# Patient Record
Sex: Female | Born: 1935 | Race: Black or African American | Hispanic: No | Smoking: Never smoker
Health system: Southern US, Community
[De-identification: ages and names within clinical notes are randomized; demographics above are authoritative.]

## PROBLEM LIST (undated history)

## (undated) DIAGNOSIS — E876 Hypokalemia: Secondary | ICD-10-CM

## (undated) DIAGNOSIS — E7800 Pure hypercholesterolemia, unspecified: Secondary | ICD-10-CM

## (undated) DIAGNOSIS — I10 Essential (primary) hypertension: Secondary | ICD-10-CM

---

## 2007-03-25 ENCOUNTER — Inpatient Hospital Stay (HOSPITAL_COMMUNITY): Admission: EM | Admit: 2007-03-25 | Discharge: 2007-03-26 | Payer: Self-pay | Admitting: Emergency Medicine

## 2007-03-25 ENCOUNTER — Ambulatory Visit: Payer: Self-pay | Admitting: Cardiovascular Disease

## 2007-03-25 ENCOUNTER — Ambulatory Visit: Payer: Self-pay | Admitting: Family Medicine

## 2007-03-26 ENCOUNTER — Encounter: Payer: Self-pay | Admitting: Family Medicine

## 2007-03-26 ENCOUNTER — Ambulatory Visit: Payer: Self-pay | Admitting: Vascular Surgery

## 2011-01-23 NOTE — Discharge Summary (Signed)
Kristy Torres, Kristy Torres              ACCOUNT NO.:  [id number]   MEDICAL RECORD NO.:  [id number]          PATIENT TYPE:  INP   LOCATION:  3709                         FACILITY:  [facility]   PHYSICIAN:  Santiago Bumpers. Hensel, M.D.DATE OF BIRTH:  [birth year]   DATE OF ADMISSION:  03/25/2007  DATE OF DISCHARGE:  03/26/2007                               DISCHARGE SUMMARY   PRIMARY CARE PHYSICIAN:  Dr. Lonie Peak in [place], [place].   CONSULTANTS:  None.   PROCEDURES:  Echocardiogram and carotid Dopplers.   REASON FOR ADMISSION:  Right-sided arm and facial numbness and weakness  presumed to be TIA.   DISCHARGE DIAGNOSES:  1. Transient ischemic attack.  2. Diabetes mellitus.  3. Hypertension.  4. Hypokalemia.   DISCHARGE MEDICATIONS:  1. Zestoretic 12.5/10 mg 1 tablet daily #14.  This was a new      prescription.  2. Caduet 10/10 mg 1 daily.  3. Klor-Con 20 mEq 1 daily.  4. Glipizide ER 2.5 mg 1 daily.  5. Atenolol 50 mg daily.  6. Aspirin 325 mg daily.   MEDICATIONS THAT WERE STOPPED:  Triamterene/hydrochlorothiazide.  This  medication was discontinued and Zestoretic was started instead because  ACE inhibitors have been shown to be beneficial to people with diabetes  and to decrease the risk of stroke.  The hydrochlorothiazide dose was  decreased due to low potassium despite daily potassium supplement.   HOSPITAL COURSE:  Kristy Torres is an 77 year old female who presented to  the emergency room with a brief episode of right-sided arm and facial  numbness as well as generalized weakness.  She called an ambulance when  this began.  By the time she arrived at the hospital, the episode had  resolved and she was no longer experiencing any numbness or weakness.  This was believed to be a TIA.  She states, approximately 12 or 14 years  ago, she had been told that she also had a TIA at that time that was a  headache with some numbness and weakness.  She has been asymptomatic in  those 12 to 14 years in between.  As her TIA was already thought to have  resolved when she arrived at [facility], she was admitted for workup and  risk ratification.  The patient had not previously been on aspirin;  therefore, she was started on aspirin 325 mg daily.  Cholesterol was  checked and her total cholesterol was 142 and HDL was wonderful at 65  and LDL was at goal of 56.  Triglycerides were 104 and Kristy Torres is on  cholesterol medicine as part of her Caduet which is a combination drug  of Norvasc and Lipitor.  She had an echocardiogram done that showed no  cardiac source for emboli.  She also had carotid Dopplers done which  were negative showing no narrowing or stenosis of the internal carotid  arteries.  Her labs all remained stable with the exception of her  potassium which was 3.2 on admission and 3.4 on the day of discharge.  She does take a daily potassium supplement.  She is believed to  be  hypokalemic due to her hydrochlorothiazide.  Regarding her diabetes,  when she came into the hospital we did hold her Glipizide until we could  see how she was eating.  She did begin to eat on the day of discharge  and the Glipizide was restarted at the time of discharge.  Regarding her  hypertension, her blood pressure was mildly elevated in the 140s and  150s and throughout her hospital stay; however, we did not worry about  this at all as the elevated blood pressure is tolerated and even  beneficial in the post stroke or even post TIA patient.  As a new  hypertension medicine was started at discharge, this should be followed  up closely in the outpatient setting and, regarding her hypokalemia, we  also decreased her hydrochlorothiazide dose in hopes that this would  help with her chronic hypokalemia.  The patient was discharged home in  good condition with her family.  She was instructed on  things that  would make her want to call the ambulance to come back to the hospital  again,  including any other episodes of numbness, tingling or weakness  especially if they were to be one-sided, as occasionally TIAs can serve  as a signal for a stroke waiting to happen in the future.   DISCHARGE FOLLOWUP:  While we generally try to make a follow-up  appointment before they leave, I was unable to reach her doctor's office  on 3 attempts today.  The line was busy; therefore, we asked her to make  an appointment herself when she got home to be seen for hospital  followup in no more than 2 weeks.   FOLLOWUP ISSUES:  I was unable to reach her doctor to discuss the  changing of her blood pressure medications from Maxzide to Zestoretic;  therefore, we would suggest he followup on her blood pressure and  calcium and consider titrating the Zestoretic up if needed and, if for  some reason she does not tolerate the ACE inhibitor and the Zestoretic,  he can discontinue and restart a blood pressure medicine of his choice.  We would also suggest a BMP be drawn to check the status of her  hypokalemia.      Ardeen Garland, MD  Electronically Signed      Santiago Bumpers. Leveda Anna, M.D.  Electronically Signed    LM/MEDQ  D:  03/26/2007  T:  03/27/2007  Job:  [id number]   cc:   Lonie Peak

## 2011-06-25 LAB — CBC
HCT: 40.1
Hemoglobin: 13.3
MCHC: 33.1
MCHC: 33.2
Platelets: 212
RDW: 13.6
RDW: 13.7

## 2011-06-25 LAB — I-STAT 8, (EC8 V) (CONVERTED LAB)
Bicarbonate: 22
Glucose, Bld: 145 — ABNORMAL HIGH
TCO2: 23
pCO2, Ven: 31.3 — ABNORMAL LOW
pH, Ven: 7.455 — ABNORMAL HIGH

## 2011-06-25 LAB — TSH: TSH: 2.397

## 2011-06-25 LAB — DIFFERENTIAL
Basophils Absolute: 0.1
Basophils Relative: 1
Eosinophils Relative: 2
Monocytes Absolute: 0.5
Monocytes Relative: 8

## 2011-06-25 LAB — LIPID PANEL
Cholesterol: 142
HDL: 65
Total CHOL/HDL Ratio: 2.2
VLDL: 21

## 2011-06-25 LAB — CARDIAC PANEL(CRET KIN+CKTOT+MB+TROPI)
CK, MB: 2.6
CK, MB: 2.8
Relative Index: 1.8
Total CK: 146
Total CK: 147
Troponin I: 0.05
Troponin I: 0.06

## 2011-06-25 LAB — URINALYSIS, ROUTINE W REFLEX MICROSCOPIC
Glucose, UA: NEGATIVE
Nitrite: NEGATIVE
Protein, ur: NEGATIVE

## 2011-06-25 LAB — HEMOGLOBIN A1C: Hgb A1c MFr Bld: 6.5 — ABNORMAL HIGH

## 2011-06-25 LAB — BASIC METABOLIC PANEL
Calcium: 9.3
GFR calc Af Amer: >60
GFR calc non Af Amer: >60
Glucose, Bld: 105 — ABNORMAL HIGH
Potassium: 3.4 — ABNORMAL LOW
Sodium: 140

## 2011-06-25 LAB — URINE MICROSCOPIC-ADD ON

## 2011-06-25 LAB — POCT I-STAT CREATININE: Operator id: 234501

## 2011-10-08 DIAGNOSIS — Z79899 Other long term (current) drug therapy: Secondary | ICD-10-CM | POA: Diagnosis not present

## 2011-10-08 DIAGNOSIS — E782 Mixed hyperlipidemia: Secondary | ICD-10-CM | POA: Diagnosis not present

## 2011-10-08 DIAGNOSIS — E7800 Pure hypercholesterolemia, unspecified: Secondary | ICD-10-CM | POA: Diagnosis not present

## 2011-10-08 DIAGNOSIS — G459 Transient cerebral ischemic attack, unspecified: Secondary | ICD-10-CM | POA: Diagnosis not present

## 2011-10-08 DIAGNOSIS — E119 Type 2 diabetes mellitus without complications: Secondary | ICD-10-CM | POA: Diagnosis not present

## 2011-10-08 DIAGNOSIS — I10 Essential (primary) hypertension: Secondary | ICD-10-CM | POA: Diagnosis not present

## 2012-01-06 ENCOUNTER — Emergency Department (HOSPITAL_COMMUNITY): Payer: Medicare Other

## 2012-01-06 ENCOUNTER — Encounter (HOSPITAL_COMMUNITY): Payer: Self-pay | Admitting: Emergency Medicine

## 2012-01-06 ENCOUNTER — Emergency Department (HOSPITAL_COMMUNITY)
Admission: EM | Admit: 2012-01-06 | Discharge: 2012-01-06 | Disposition: A | Discharge status: Home / Self Care | Payer: Medicare Other | Attending: Emergency Medicine | Admitting: Emergency Medicine

## 2012-01-06 DIAGNOSIS — I10 Essential (primary) hypertension: Secondary | ICD-10-CM | POA: Insufficient documentation

## 2012-01-06 DIAGNOSIS — E7800 Pure hypercholesterolemia, unspecified: Secondary | ICD-10-CM | POA: Insufficient documentation

## 2012-01-06 DIAGNOSIS — I499 Cardiac arrhythmia, unspecified: Secondary | ICD-10-CM | POA: Insufficient documentation

## 2012-01-06 DIAGNOSIS — M25569 Pain in unspecified knee: Secondary | ICD-10-CM | POA: Diagnosis not present

## 2012-01-06 DIAGNOSIS — R42 Dizziness and giddiness: Secondary | ICD-10-CM | POA: Insufficient documentation

## 2012-01-06 DIAGNOSIS — R197 Diarrhea, unspecified: Secondary | ICD-10-CM | POA: Diagnosis not present

## 2012-01-06 DIAGNOSIS — Z79899 Other long term (current) drug therapy: Secondary | ICD-10-CM | POA: Insufficient documentation

## 2012-01-06 DIAGNOSIS — M79609 Pain in unspecified limb: Secondary | ICD-10-CM | POA: Diagnosis not present

## 2012-01-06 DIAGNOSIS — I6789 Other cerebrovascular disease: Secondary | ICD-10-CM | POA: Diagnosis not present

## 2012-01-06 DIAGNOSIS — M25562 Pain in left knee: Secondary | ICD-10-CM

## 2012-01-06 DIAGNOSIS — E119 Type 2 diabetes mellitus without complications: Secondary | ICD-10-CM | POA: Diagnosis not present

## 2012-01-06 DIAGNOSIS — M7989 Other specified soft tissue disorders: Secondary | ICD-10-CM

## 2012-01-06 DIAGNOSIS — I69998 Other sequelae following unspecified cerebrovascular disease: Secondary | ICD-10-CM | POA: Diagnosis not present

## 2012-01-06 HISTORY — DX: Hypokalemia: E87.6

## 2012-01-06 HISTORY — DX: Pure hypercholesterolemia, unspecified: E78.00

## 2012-01-06 HISTORY — DX: Essential (primary) hypertension: I10

## 2012-01-06 LAB — URINALYSIS, ROUTINE W REFLEX MICROSCOPIC
Glucose, UA: NEGATIVE mg/dL
Hgb urine dipstick: NEGATIVE
Ketones, ur: NEGATIVE mg/dL
Leukocytes, UA: NEGATIVE
Protein, ur: NEGATIVE mg/dL
Urobilinogen, UA: 0.2 mg/dL (ref 0.0–1.0)

## 2012-01-06 LAB — DIFFERENTIAL
Basophils Relative: 0 % (ref 0–1)
Eosinophils Absolute: 0.2 10*3/uL (ref 0.0–0.7)
Eosinophils Relative: 2 % (ref 0–5)
Lymphs Abs: 2 10*3/uL (ref 0.7–4.0)
Neutrophils Relative %: 63 % (ref 43–77)

## 2012-01-06 LAB — POCT I-STAT, CHEM 8
Calcium, Ion: 1.28 mmol/L (ref 1.12–1.32)
Chloride: 104 mEq/L (ref 96–112)
Creatinine, Ser: 1 mg/dL (ref 0.50–1.10)
Glucose, Bld: 126 mg/dL — ABNORMAL HIGH (ref 70–99)
Hemoglobin: 13.6 g/dL (ref 12.0–15.0)
Potassium: 4.4 mEq/L (ref 3.5–5.1)

## 2012-01-06 LAB — CBC
MCH: 32.6 pg (ref 26.0–34.0)
MCHC: 33.8 g/dL (ref 30.0–36.0)
MCV: 96.3 fL (ref 78.0–100.0)
Platelets: 219 10*3/uL (ref 150–400)
RBC: 4.08 MIL/uL (ref 3.87–5.11)

## 2012-01-06 NOTE — ED Provider Notes (Signed)
History     CSN: [id number]  Arrival date & time 01/06/12  1530   First MD Initiated Contact with Patient 01/06/12 1548      Chief Complaint  Patient presents with  . Knee Pain  . Dizziness  . Diarrhea    (Consider location/radiation/quality/duration/timing/severity/associated sxs/prior treatment) HPI Comments: Vision presents today with 3 individual complaints first being diarrhea, which consists of several loose stools per day for the last 2 days.  Denies seeing blood, nausea, vomiting.  Second complaint is dizziness, she cannot relate if this is with position change.  Cannot tell in the room is spinning or she is spending to invade the feeling of, dizziness that's been going on for "a while".  Third complaint is left posterior knee pain that has been going on for "a while" but was worse today while walking.  Denies injury, lower leg swelling, history of DVT.  Patient is a 78 y.o. female presenting with knee pain and diarrhea. The history is provided by the patient.  Knee Pain This is a recurrent problem. The current episode started 1 to 4 weeks ago. The problem occurs intermittently. The problem has been unchanged. Associated symptoms include vertigo. Pertinent negatives include no chest pain, chills, coughing, myalgias, nausea, rash or urinary symptoms.  Diarrhea The primary symptoms include diarrhea. Primary symptoms do not include nausea, myalgias or rash.  The illness does not include chills.    Past Medical History  Diagnosis Date  . Diabetes mellitus   . Hypertension   . Hypokalemia   . High cholesterol     No past surgical history on file.  No family history on file.  History  Substance Use Topics  . Smoking status: Never Smoker   . Smokeless tobacco: Not on file  . Alcohol Use: No    OB History    Grav Para Term Preterm Abortions TAB SAB Ect Mult Living                  Review of Systems  Constitutional: Negative for chills.  Respiratory: Negative for  cough.   Cardiovascular: Negative for chest pain.  Gastrointestinal: Positive for diarrhea. Negative for nausea.  Musculoskeletal: Negative for myalgias.  Skin: Negative for rash.  Neurological: Positive for vertigo.    Allergies  Review of patient's allergies indicates no known allergies.  Home Medications   Current Outpatient Rx  Name Route Sig Dispense Refill  . ATENOLOL 50 MG PO TABS Oral Take 50 mg by mouth daily.    . ATORVASTATIN CALCIUM 10 MG PO TABS Oral Take 10 mg by mouth daily.    Marland Kitchen GLIPIZIDE ER 2.5 MG PO TB24 Oral Take 2.5 mg by mouth daily.    Marland Kitchen LOSARTAN POTASSIUM-HCTZ 100-25 MG PO TABS Oral Take 1 tablet by mouth daily.    Marland Kitchen POTASSIUM CHLORIDE CRYS ER 20 MEQ PO TBCR Oral Take 20 mEq by mouth 2 (two) times daily.      BP 167/68  Pulse 74  Temp(Src) 98.8 F (37.1 C) (Oral)  Resp 17  SpO2 97%  Physical Exam  Constitutional: She is oriented to person, place, and time. She appears well-developed and well-nourished.  HENT:  Head: Normocephalic.  Eyes: Pupils are equal, round, and reactive to light.  Neck: Normal range of motion.  Cardiovascular: Normal rate.  An irregular rhythm present.  Pulmonary/Chest: Effort normal.  Abdominal: Soft. Bowel sounds are normal. She exhibits no distension. There is no tenderness.       No rash abrasions  on abdomen   Musculoskeletal: She exhibits no edema.       Legs:      Calf tenderness or swelling  Neurological: She is alert and oriented to person, place, and time.  Skin: Skin is warm and dry. No rash noted. No erythema.    ED Course  Procedures (including critical care time)  Labs Reviewed  POCT I-STAT, CHEM 8 - Abnormal; Notable for the following:    Glucose, Bld 126 (*)    All other components within normal limits  CBC  DIFFERENTIAL  URINALYSIS, ROUTINE W REFLEX MICROSCOPIC   Ct Head Wo Contrast  01/06/2012  *RADIOLOGY REPORT*  Clinical Data: Dizziness for two days.  CT HEAD WITHOUT CONTRAST  Technique:   Contiguous axial images were obtained from the base of the skull through the vertex without contrast.  Comparison: Head CT 03/25/2007.  Findings: There is new encephalomalacia in the left occipital lobe consistent with an interval nonacute infarct in the left posterior cerebral artery distribution.  There is mildly progressive chronic small vessel ischemic change in the periventricular white matter. No acute intracranial hemorrhage, mass lesion, brain edema or extra- axial fluid collection is seen.  There is no evidence of acute infarct.  The visualized paranasal sinuses, mastoids and middle ears are clear.  The internal auditory canals appear symmetric.  IMPRESSION:  1.  No acute or reversible intracranial findings identified. 2.  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  This does not have an acute appearance. Mildly progressive chronic small vessel ischemic changes.  Original Report Authenticated By: Gerrianne Scale, M.D.     1. Hypertension   2. Knee pain, left   3. Dizzy spells    ED ECG REPORT   Date: 01/06/2012  EKG Time: 6:48 PM  Rate: 73  Rhythm: normal sinus rhythm,  there are no previous tracings available for comparison  Axis: normal  Intervals:none  ST&T Change: multiple PVC unifocal  Narrative Interpretation: abnormal             MDM  Bakers cyst a possibility L knee area.  She is quite hypertensive, although she, states she's been taking her medication on a regular basis, right after breakfast.  She states that when she saw her primary care physician 3 months ago.  It was in the high normal, range.  She's not due for a reexamination by him for another 3 months.  She has been taking the same blood pressure medicine for "a while"        Arman Filter, NP 01/06/12 [phone]

## 2012-01-06 NOTE — ED Notes (Signed)
Pt denies SOB

## 2012-01-06 NOTE — Discharge Instructions (Signed)
Arterial Hypertension Arterial hypertension (high blood pressure) is a condition of elevated pressure in your blood vessels. Hypertension over a long period of time is a risk factor for strokes, heart attacks, and heart failure. It is also the leading cause of kidney (renal) failure.  CAUSES   In Adults -- Over 90% of all hypertension has no known cause. This is called essential or primary hypertension. In the other 10% of people with hypertension, the increase in blood pressure is caused by another disorder. This is called secondary hypertension. Important causes of secondary hypertension are:   Heavy alcohol use.   Obstructive sleep apnea.   Hyperaldosterosim (Conn's syndrome).   Steroid use.   Chronic kidney failure.   Hyperparathyroidism.   Medications.   Renal artery stenosis.   Pheochromocytoma.   Cushing's disease.   Coarctation of the aorta.   Scleroderma renal crisis.   Licorice (in excessive amounts).   Drugs (cocaine, methamphetamine).  Your caregiver can explain any items above that apply to you.  In Children -- Secondary hypertension is more common and should always be considered.   Pregnancy -- Few women of childbearing age have high blood pressure. However, up to 10% of them develop hypertension of pregnancy. Generally, this will not harm the woman. It Even be a sign of 3 complications of pregnancy: preeclampsia, HELLP syndrome, and eclampsia. Follow up and control with medication is necessary.  SYMPTOMS   This condition normally does not produce any noticeable symptoms. It is usually found during a routine exam.   Malignant hypertension is a late problem of high blood pressure. It Monger have the following symptoms:   Headaches.   Blurred vision.   End-organ damage (this means your kidneys, heart, lungs, and other organs are being damaged).   Stressful situations can increase the blood pressure. If a person with normal blood pressure has their blood  pressure go up while being seen by their caregiver, this is often termed "white coat hypertension." Its importance is not known. It Alkire be related with eventually developing hypertension or complications of hypertension.   Hypertension is often confused with mental tension, stress, and anxiety.  DIAGNOSIS  The diagnosis is made by 3 separate blood pressure measurements. They are taken at least 1 week apart from each other. If there is organ damage from hypertension, the diagnosis Lemire be made without repeat measurements. Hypertension is usually identified by having blood pressure readings:  Above 140/90 mmHg measured in both arms, at 3 separate times, over a couple weeks.   Over 130/80 mmHg should be considered a risk factor and Grisanti require treatment in patients with diabetes.  Blood pressure readings over 120/80 mmHg are called "pre-hypertension" even in non-diabetic patients. To get a true blood pressure measurement, use the following guidelines. Be aware of the factors that can alter blood pressure readings.  Take measurements at least 1 hour after caffeine.   Take measurements 30 minutes after smoking and without any stress. This is another reason to quit smoking - it raises your blood pressure.   Use a proper cuff size. Ask your caregiver if you are not sure about your cuff size.   Most home blood pressure cuffs are automatic. They will measure systolic and diastolic pressures. The systolic pressure is the pressure reading at the start of sounds. Diastolic pressure is the pressure at which the sounds disappear. If you are elderly, measure pressures in multiple postures. Try sitting, lying or standing.   Sit at rest for a minimum of   5 minutes before taking measurements.   You should not be on any medications like decongestants. These are found in many cold medications.   Record your blood pressure readings and review them with your caregiver.  If you have hypertension:  Your caregiver  may do tests to be sure you do not have secondary hypertension (see "causes" above).   Your caregiver may also look for signs of metabolic syndrome. This is also called Syndrome X or Insulin Resistance Syndrome. You may have this syndrome if you have type 2 diabetes, abdominal obesity, and abnormal blood lipids in addition to hypertension.   Your caregiver will take your medical and family history and perform a physical exam.   Diagnostic tests may include blood tests (for glucose, cholesterol, potassium, and kidney function), a urinalysis, or an EKG. Other tests may also be necessary depending on your condition.  PREVENTION  There are important lifestyle issues that you can adopt to reduce your chance of developing hypertension:  Maintain a normal weight.   Limit the amount of salt (sodium) in your diet.   Exercise often.   Limit alcohol intake.   Get enough potassium in your diet. Discuss specific advice with your caregiver.   Follow a DASH diet (dietary approaches to stop hypertension). This diet is rich in fruits, vegetables, and low-fat dairy products, and avoids certain fats.  PROGNOSIS  Essential hypertension cannot be cured. Lifestyle changes and medical treatment can lower blood pressure and reduce complications. The prognosis of secondary hypertension depends on the underlying cause. Many people whose hypertension is controlled with medicine or lifestyle changes can live a normal, healthy life.  RISKS AND COMPLICATIONS  While high blood pressure alone is not an illness, it often requires treatment due to its short- and long-term effects on many organs. Hypertension increases your risk for:  CVAs or strokes (cerebrovascular accident).   Heart failure due to chronically high blood pressure (hypertensive cardiomyopathy).   Heart attack (myocardial infarction).   Damage to the retina (hypertensive retinopathy).   Kidney failure (hypertensive nephropathy).  Your caregiver can  explain list items above that apply to you. Treatment of hypertension can significantly reduce the risk of complications. TREATMENT   For overweight patients, weight loss and regular exercise are recommended. Physical fitness lowers blood pressure.   Mild hypertension is usually treated with diet and exercise. A diet rich in fruits and vegetables, fat-free dairy products, and foods low in fat and salt (sodium) can help lower blood pressure. Decreasing salt intake decreases blood pressure in a 1/3 of people.   Stop smoking if you are a smoker.  The steps above are highly effective in reducing blood pressure. While these actions are easy to suggest, they are difficult to achieve. Most patients with moderate or severe hypertension end up requiring medications to bring their blood pressure down to a normal level. There are several classes of medications for treatment. Blood pressure pills (antihypertensives) will lower blood pressure by their different actions. Lowering the blood pressure by 10 mmHg may decrease the risk of complications by as much as 25%. The goal of treatment is effective blood pressure control. This will reduce your risk for complications. Your caregiver will help you determine the best treatment for you according to your lifestyle. What is excellent treatment for one person, may not be for you. HOME CARE INSTRUCTIONS   Do not smoke.   Follow the lifestyle changes outlined in the "Prevention" section.   If you are on medications, follow the directions   carefully. Blood pressure medications must be taken as prescribed. Skipping doses reduces their benefit. It also puts you at risk for problems.   Follow up with your caregiver, as directed.   If you are asked to monitor your blood pressure at home, follow the guidelines in the "Diagnosis" section above.  SEEK MEDICAL CARE IF:   You think you are having medication side effects.   You have recurrent headaches or lightheadedness.     You have swelling in your ankles.   You have trouble with your vision.  SEEK IMMEDIATE MEDICAL CARE IF:   You have sudden onset of chest pain or pressure, difficulty breathing, or other symptoms of a heart attack.   You have a severe headache.   You have symptoms of a stroke (such as sudden weakness, difficulty speaking, difficulty walking).  MAKE SURE YOU:   Understand these instructions.   Will watch your condition.   Will get help right away if you are not doing well or get worse.  Document Released: 08/27/2005 Document Revised: 08/16/2011 Document Reviewed: 03/27/2007 [facility] Patient Information 2012 [place], [place].Arthralgia Your caregiver has diagnosed you as suffering from an arthralgia. Arthralgia means there is pain in a joint. This can come from many reasons including:  Bruising the joint which causes soreness (inflammation) in the joint.   Wear and tear on the joints which occur as we grow older (osteoarthritis).   Overusing the joint.   Various forms of arthritis.   Infections of the joint.  Regardless of the cause of pain in your joint, most of these different pains respond to anti-inflammatory drugs and rest. The exception to this is when a joint is infected, and these cases are treated with antibiotics, if it is a bacterial infection. HOME CARE INSTRUCTIONS   Rest the injured area for as long as directed by your caregiver. Then slowly start using the joint as directed by your caregiver and as the pain allows. Crutches as directed may be useful if the ankles, knees or hips are involved. If the knee was splinted or casted, continue use and care as directed. If an stretchy or elastic wrapping bandage has been applied today, it should be removed and re-applied every 3 to 4 hours. It should not be applied tightly, but firmly enough to keep swelling down. Watch toes and feet for swelling, bluish discoloration, coldness, numbness or excessive pain. If any of these  problems (symptoms) occur, remove the ace bandage and re-apply more loosely. If these symptoms persist, contact your caregiver or return to this location.   For the first 24 hours, keep the injured extremity elevated on pillows while lying down.   Apply ice for 15 to 20 minutes to the sore joint every couple hours while awake for the first half day. Then 3 to 4 times per day for the first 48 hours. Put the ice in a plastic bag and place a towel between the bag of ice and your skin.   Wear any splinting, casting, elastic bandage applications, or slings as instructed.   Only take over-the-counter or prescription medicines for pain, discomfort, or fever as directed by your caregiver. Do not use aspirin immediately after the injury unless instructed by your physician. Aspirin can cause increased bleeding and bruising of the tissues.   If you were given crutches, continue to use them as instructed and do not resume weight bearing on the sore joint until instructed.  Persistent pain and inability to use the sore joint as directed for  more than 2 to 3 days are warning signs indicating that you should see a caregiver for a follow-up visit as soon as possible. Initially, a hairline fracture (break in bone) may not be evident on X-rays. Persistent pain and swelling indicate that further evaluation, non-weight bearing or use of the joint (use of crutches or slings as instructed), or further X-rays are indicated. X-rays may sometimes not show a small fracture until a week or 10 days later. Make a follow-up appointment with your own caregiver or one to whom we have referred you. A radiologist (specialist in reading X-rays) may read your X-rays. Make sure you know how you are to obtain your X-ray results. Do not assume everything is normal if you do not hear from [place]. SEEK MEDICAL CARE IF: Bruising, swelling, or pain increases. SEEK IMMEDIATE MEDICAL CARE IF:   Your fingers or toes are numb or blue.   The pain is  not responding to medications and continues to stay the same or get worse.   The pain in your joint becomes severe.   You develop a fever over 102 F (38.9 C).   It becomes impossible to move or use the joint.  MAKE SURE YOU:   Understand these instructions.   Will watch your condition.   Will get help right away if you are not doing well or get worse.  Document Released: 08/27/2005 Document Revised: 08/16/2011 Document Reviewed: 04/14/2008 [facility] Patient Information 2012 [place], [place]. As discussed no specific cause for your knee pain has been identified today, you do not have a blood clot or a Baker's cyst.  This was evaluated with vascular Doppler.  You do have poorly controlled hypertension.  Please make a point with your primary care physician to be seen.  This week for further evaluation.  Recheck of your blood pressure and possibly additional medication for better control.  Your head CT is normal.  No indication of stroke or mass that would cause dizziness.  Please followup with your primary care physician for this as wellBenign Positional Vertigo Vertigo means you feel like you or your surroundings are moving when they are not. Benign positional vertigo is the most common form of vertigo. Benign means that the cause of your condition is not serious. Benign positional vertigo is more common in older adults. CAUSES  Benign positional vertigo is the result of an upset in the labyrinth system. This is an area in the middle ear that helps control your balance. This may be caused by a viral infection, head injury, or repetitive motion. However, often no specific cause is found. SYMPTOMS  Symptoms of benign positional vertigo occur when you move your head or eyes in different directions. Some of the symptoms may include:  Loss of balance and falls.   Vomiting.   Blurred vision.   Dizziness.   Nausea.   Involuntary eye movements (nystagmus).  DIAGNOSIS  Benign positional  vertigo is usually diagnosed by physical exam. If the specific cause of your benign positional vertigo is unknown, your caregiver may perform imaging tests, such as magnetic resonance imaging (MRI) or computed tomography (CT). TREATMENT  Your caregiver may recommend movements or procedures to correct the benign positional vertigo. Medicines such as meclizine, benzodiazepines, and medicines for nausea may be used to treat your symptoms. In rare cases, if your symptoms are caused by certain conditions that affect the inner ear, you may need surgery. HOME CARE INSTRUCTIONS   Follow your caregiver's instructions.   Move slowly. Do not  make sudden body or head movements.   Avoid driving.   Avoid operating heavy machinery.   Avoid performing any tasks that would be dangerous to you or others during a vertigo episode.   Drink enough fluids to keep your urine clear or pale yellow.  SEEK IMMEDIATE MEDICAL CARE IF:   You develop problems with walking, weakness, numbness, or using your arms, hands, or legs.   You have difficulty speaking.   You develop severe headaches.   Your nausea or vomiting continues or gets worse.   You develop visual changes.   Your family or friends notice any behavioral changes.   Your condition gets worse.   You have a fever.   You develop a stiff neck or sensitivity to light.  MAKE SURE YOU:   Understand these instructions.   Will watch your condition.   Will get help right away if you are not doing well or get worse.  Document Released: 06/04/2006 Document Revised: 08/16/2011 Document Reviewed: 05/17/2011 [facility] Patient Information 2012 [place], [place].

## 2012-01-06 NOTE — Progress Notes (Signed)
VASCULAR LAB PRELIMINARY  PRELIMINARY  PRELIMINARY  PRELIMINARY  Left lower extremity venous Dopplers completed.    Preliminary report:  There is no DVT, SVT or Baker's cyst noted in the left lower extremity.  Sherren Kerns [place], 01/06/2012, 5:53 PM

## 2012-01-06 NOTE — ED Notes (Signed)
Pt refused in and out cath. Pt used bedpan with nurse tech assistance.

## 2012-01-06 NOTE — ED Notes (Addendum)
Patient transported to ct

## 2012-01-06 NOTE — ED Provider Notes (Signed)
Medical screening examination/treatment/procedure(s) were conducted as a shared visit with non-physician practitioner(s) and myself.  I personally evaluated the patient during the encounter  Patient presents emergent complaining of left knee pain since waking up this morning. she states she's had this trouble previously in the past. The patient states that she came into the emergency room today because she just want to know what was causing it. The pain is not actively severe. At rest it does not bother her. She is not having any trouble with any chest pain or shortness of breath. Patient also incidentally mentions she's had some trouble with diarrhea.  It was more severe couple of days ago and the last loose stool was yesterday. Patient says she's had some intermittent vague dizziness but no trouble with speech or coordination or balance.  Celene Kras, MD 01/06/12 [phone]

## 2012-01-06 NOTE — ED Notes (Addendum)
C/o L knee pain while walking since this morning.  Also reports dizziness and diarrhea x 2 days.

## 2012-04-07 DIAGNOSIS — E119 Type 2 diabetes mellitus without complications: Secondary | ICD-10-CM | POA: Diagnosis not present

## 2012-04-07 DIAGNOSIS — E7800 Pure hypercholesterolemia, unspecified: Secondary | ICD-10-CM | POA: Diagnosis not present

## 2012-04-07 DIAGNOSIS — I10 Essential (primary) hypertension: Secondary | ICD-10-CM | POA: Diagnosis not present

## 2012-04-07 DIAGNOSIS — Z79899 Other long term (current) drug therapy: Secondary | ICD-10-CM | POA: Diagnosis not present

## 2012-10-22 DIAGNOSIS — Z6837 Body mass index (BMI) 37.0-37.9, adult: Secondary | ICD-10-CM | POA: Diagnosis not present

## 2012-10-22 DIAGNOSIS — E119 Type 2 diabetes mellitus without complications: Secondary | ICD-10-CM | POA: Diagnosis not present

## 2012-10-22 DIAGNOSIS — E7800 Pure hypercholesterolemia, unspecified: Secondary | ICD-10-CM | POA: Diagnosis not present

## 2012-10-22 DIAGNOSIS — Z79899 Other long term (current) drug therapy: Secondary | ICD-10-CM | POA: Diagnosis not present

## 2012-10-22 DIAGNOSIS — I10 Essential (primary) hypertension: Secondary | ICD-10-CM | POA: Diagnosis not present

## 2013-02-15 ENCOUNTER — Emergency Department (HOSPITAL_COMMUNITY)
Admission: EM | Admit: 2013-02-15 | Discharge: 2013-02-15 | Disposition: A | Discharge status: Home / Self Care | Payer: Medicare Other | Attending: Emergency Medicine | Admitting: Emergency Medicine

## 2013-02-15 ENCOUNTER — Encounter (HOSPITAL_COMMUNITY): Payer: Self-pay | Admitting: *Deleted

## 2013-02-15 DIAGNOSIS — E119 Type 2 diabetes mellitus without complications: Secondary | ICD-10-CM | POA: Insufficient documentation

## 2013-02-15 DIAGNOSIS — I10 Essential (primary) hypertension: Secondary | ICD-10-CM | POA: Diagnosis not present

## 2013-02-15 DIAGNOSIS — E876 Hypokalemia: Secondary | ICD-10-CM

## 2013-02-15 DIAGNOSIS — E7800 Pure hypercholesterolemia, unspecified: Secondary | ICD-10-CM | POA: Diagnosis not present

## 2013-02-15 DIAGNOSIS — R197 Diarrhea, unspecified: Secondary | ICD-10-CM | POA: Insufficient documentation

## 2013-02-15 DIAGNOSIS — Z79899 Other long term (current) drug therapy: Secondary | ICD-10-CM | POA: Diagnosis not present

## 2013-02-15 DIAGNOSIS — H6121 Impacted cerumen, right ear: Secondary | ICD-10-CM

## 2013-02-15 DIAGNOSIS — H6120 Impacted cerumen, unspecified ear: Secondary | ICD-10-CM | POA: Diagnosis not present

## 2013-02-15 DIAGNOSIS — Z7982 Long term (current) use of aspirin: Secondary | ICD-10-CM | POA: Insufficient documentation

## 2013-02-15 LAB — CBC WITH DIFFERENTIAL/PLATELET
Basophils Absolute: 0.1 10*3/uL (ref 0.0–0.1)
Basophils Relative: 1 % (ref 0–1)
Eosinophils Absolute: 0.3 10*3/uL (ref 0.0–0.7)
Eosinophils Relative: 4 % (ref 0–5)
HCT: 42.1 % (ref 36.0–46.0)
Hemoglobin: 14.1 g/dL (ref 12.0–15.0)
Lymphocytes Relative: 28 % (ref 12–46)
Lymphs Abs: 1.8 10*3/uL (ref 0.7–4.0)
MCH: 31.8 pg (ref 26.0–34.0)
MCHC: 33.5 g/dL (ref 30.0–36.0)
MCV: 95 fL (ref 78.0–100.0)
Monocytes Absolute: 0.6 10*3/uL (ref 0.1–1.0)
Monocytes Relative: 10 % (ref 3–12)
Neutro Abs: 3.6 10*3/uL (ref 1.7–7.7)
Neutrophils Relative %: 57 % (ref 43–77)
Platelets: 206 10*3/uL (ref 150–400)
RBC: 4.43 MIL/uL (ref 3.87–5.11)
RDW: 12.8 % (ref 11.5–15.5)
WBC: 6.4 10*3/uL (ref 4.0–10.5)

## 2013-02-15 LAB — COMPREHENSIVE METABOLIC PANEL
ALT: 18 U/L (ref 0–35)
AST: 21 U/L (ref 0–37)
Albumin: 3.7 g/dL (ref 3.5–5.2)
Alkaline Phosphatase: 66 U/L (ref 39–117)
BUN: 12 mg/dL (ref 6–23)
CO2: 30 mEq/L (ref 19–32)
Calcium: 10.1 mg/dL (ref 8.4–10.5)
Chloride: 102 mEq/L (ref 96–112)
Creatinine, Ser: 0.91 mg/dL (ref 0.50–1.10)
GFR calc Af Amer: 62 mL/min — ABNORMAL LOW (ref >90)
GFR calc non Af Amer: 54 mL/min — ABNORMAL LOW (ref >90)
Glucose, Bld: 104 mg/dL — ABNORMAL HIGH (ref 70–99)
Potassium: 3.2 mEq/L — ABNORMAL LOW (ref 3.5–5.1)
Sodium: 140 mEq/L (ref 135–145)
Total Bilirubin: 0.2 mg/dL — ABNORMAL LOW (ref 0.3–1.2)
Total Protein: 7.7 g/dL (ref 6.0–8.3)

## 2013-02-15 LAB — URINALYSIS, ROUTINE W REFLEX MICROSCOPIC
Bilirubin Urine: NEGATIVE
Glucose, UA: NEGATIVE mg/dL
Hgb urine dipstick: NEGATIVE
Ketones, ur: NEGATIVE mg/dL
Nitrite: NEGATIVE
Protein, ur: NEGATIVE mg/dL
Specific Gravity, Urine: 1.009 (ref 1.005–1.030)
Urobilinogen, UA: 0.2 mg/dL (ref 0.0–1.0)
pH: 6 (ref 5.0–8.0)

## 2013-02-15 LAB — LIPASE, BLOOD: Lipase: 21 U/L (ref 11–59)

## 2013-02-15 LAB — URINE MICROSCOPIC-ADD ON

## 2013-02-15 MED ORDER — LOPERAMIDE HCL 2 MG PO CAPS
4.0000 mg | ORAL_CAPSULE | Freq: Once | ORAL | Status: AC
  Administered 2013-02-15: 4 mg via ORAL
  Filled 2013-02-15: qty 2

## 2013-02-15 MED ORDER — POTASSIUM CHLORIDE CRYS ER 20 MEQ PO TBCR
40.0000 meq | EXTENDED_RELEASE_TABLET | Freq: Once | ORAL | Status: AC
  Administered 2013-02-15: 40 meq via ORAL
  Filled 2013-02-15: qty 2

## 2013-02-15 MED ORDER — CARBAMIDE PEROXIDE 6.5 % OT SOLN: 5.0000 [drp] | Freq: Two times a day (BID) | OTIC | Status: DC

## 2013-02-15 MED ORDER — SODIUM CHLORIDE 0.9 % IV BOLUS (SEPSIS)
1000.0000 mL | Freq: Once | INTRAVENOUS | Status: AC
  Administered 2013-02-15: 1000 mL via INTRAVENOUS

## 2013-02-15 NOTE — ED Notes (Signed)
Pt reports having diarrhea for over the past week, denies any abd pain or vomiting. Also wants having "popping" to right ear. No acute distress noted at triage.

## 2013-02-19 NOTE — ED Provider Notes (Signed)
History    89yf with diarrhea. Intermittent since last week. No blood in stool. No melena. Denies pain anywhere. No fever or chills. No urinary complaints. No intervention prior to arrival. No sick contacts. No recent abx use. Also c/o occasional popping sensation in R ear. Denies pain/drainage.  CSN: [id number]  Arrival date & time 02/15/13  1736   First MD Initiated Contact with Patient 02/15/13 1840      Chief Complaint  Patient presents with  . Diarrhea    (Consider location/radiation/quality/duration/timing/severity/associated sxs/prior treatment) HPI  Past Medical History  Diagnosis Date  . Diabetes mellitus   . Hypertension   . Hypokalemia   . High cholesterol     History reviewed. No pertinent past surgical history.  History reviewed. No pertinent family history.  History  Substance Use Topics  . Smoking status: Never Smoker   . Smokeless tobacco: Not on file  . Alcohol Use: No    OB History   Grav Para Term Preterm Abortions TAB SAB Ect Mult Living                  Review of Systems  All systems reviewed and negative, other than as noted in HPI.   Allergies  Review of patient's allergies indicates no known allergies.  Home Medications   Current Outpatient Rx  Name  Route  Sig  Dispense  Refill  . aspirin 325 MG EC tablet   Oral   Take 325 mg by mouth daily.         Marland Kitchen atenolol (TENORMIN) 50 MG tablet   Oral   Take 50 mg by mouth daily.         Marland Kitchen atorvastatin (LIPITOR) 10 MG tablet   Oral   Take 10 mg by mouth at bedtime.          Marland Kitchen glipiZIDE (GLUCOTROL XL) 2.5 MG 24 hr tablet   Oral   Take 2.5 mg by mouth daily.         Marland Kitchen losartan-hydrochlorothiazide (HYZAAR) 100-25 MG per tablet   Oral   Take 1 tablet by mouth daily.         . potassium chloride SA (K-DUR,KLOR-CON) 20 MEQ tablet   Oral   Take 40 mEq by mouth daily.          . carbamide peroxide (DEBROX) 6.5 % otic solution   Otic   Place 5 drops in ear(s) 2 (two)  times daily.   15 mL   0     BP 190/70  Pulse 57  Temp(Src) 98.7 F (37.1 C) (Oral)  Resp 16  SpO2 100%  Physical Exam  Nursing note and vitals reviewed. Constitutional: She appears well-developed and well-nourished. No distress.  HENT:  Head: Normocephalic and atraumatic.  Large amount of ear wax in b/l aud canals  Eyes: Conjunctivae are normal. Right eye exhibits no discharge. Left eye exhibits no discharge.  Neck: Neck supple.  Cardiovascular: Normal rate, regular rhythm and normal heart sounds.  Exam reveals no gallop and no friction rub.   No murmur heard. Pulmonary/Chest: Effort normal and breath sounds normal. No respiratory distress.  Abdominal: Soft. She exhibits no distension and no mass. There is no tenderness.  Musculoskeletal: She exhibits no edema and no tenderness.  Neurological: She is alert.  Skin: Skin is warm and dry.  Psychiatric: She has a normal mood and affect. Her behavior is normal. Thought content normal.    ED Course  Procedures (including critical care time)  Labs Reviewed  COMPREHENSIVE METABOLIC PANEL - Abnormal; Notable for the following:    Potassium 3.2 (*)    Glucose, Bld 104 (*)    Total Bilirubin 0.2 (*)    GFR calc non Af Amer 54 (*)    GFR calc Af Amer 62 (*)    All other components within normal limits  URINALYSIS, ROUTINE W REFLEX MICROSCOPIC - Abnormal; Notable for the following:    APPearance CLOUDY (*)    Leukocytes, UA TRACE (*)    All other components within normal limits  URINE MICROSCOPIC-ADD ON - Abnormal; Notable for the following:    Squamous Epithelial / LPF FEW (*)    All other components within normal limits  CBC WITH DIFFERENTIAL  LIPASE, BLOOD   No results found.   1. Diarrhea   2. Hypokalemia   3. Excessive cerumen in ear canal, right       MDM  89yf with diarrhea. Suspect viral illness. Afebrile. Nonbloody stool. Benign abdominal exam. W/u reassuring aside from mild hypokalemia. Repleted. Pt tx'd  with IVF. No diarrhea in ED stay of several hours. I have a low suspicion for emergent etiology of her symptoms. I feel she is safe for DC. Return precautions discussed with pt/family.         Raeford Razor, MD 02/19/13 [phone]

## 2013-03-17 DIAGNOSIS — E7800 Pure hypercholesterolemia, unspecified: Secondary | ICD-10-CM | POA: Diagnosis not present

## 2013-03-17 DIAGNOSIS — E119 Type 2 diabetes mellitus without complications: Secondary | ICD-10-CM | POA: Diagnosis not present

## 2013-03-17 DIAGNOSIS — I10 Essential (primary) hypertension: Secondary | ICD-10-CM | POA: Diagnosis not present

## 2013-03-17 DIAGNOSIS — Z9181 History of falling: Secondary | ICD-10-CM | POA: Diagnosis not present

## 2013-03-17 DIAGNOSIS — Z1331 Encounter for screening for depression: Secondary | ICD-10-CM | POA: Diagnosis not present

## 2013-07-20 DIAGNOSIS — E7800 Pure hypercholesterolemia, unspecified: Secondary | ICD-10-CM | POA: Diagnosis not present

## 2013-07-20 DIAGNOSIS — Z79899 Other long term (current) drug therapy: Secondary | ICD-10-CM | POA: Diagnosis not present

## 2013-07-20 DIAGNOSIS — E119 Type 2 diabetes mellitus without complications: Secondary | ICD-10-CM | POA: Diagnosis not present

## 2013-07-20 DIAGNOSIS — G459 Transient cerebral ischemic attack, unspecified: Secondary | ICD-10-CM | POA: Diagnosis not present

## 2013-07-20 DIAGNOSIS — I10 Essential (primary) hypertension: Secondary | ICD-10-CM | POA: Diagnosis not present

## 2013-11-17 DIAGNOSIS — E7800 Pure hypercholesterolemia, unspecified: Secondary | ICD-10-CM | POA: Diagnosis not present

## 2013-11-17 DIAGNOSIS — E119 Type 2 diabetes mellitus without complications: Secondary | ICD-10-CM | POA: Diagnosis not present

## 2013-11-17 DIAGNOSIS — Z9181 History of falling: Secondary | ICD-10-CM | POA: Diagnosis not present

## 2013-11-17 DIAGNOSIS — G459 Transient cerebral ischemic attack, unspecified: Secondary | ICD-10-CM | POA: Diagnosis not present

## 2013-11-17 DIAGNOSIS — Z79899 Other long term (current) drug therapy: Secondary | ICD-10-CM | POA: Diagnosis not present

## 2013-11-17 DIAGNOSIS — Z6835 Body mass index (BMI) 35.0-35.9, adult: Secondary | ICD-10-CM | POA: Diagnosis not present

## 2013-11-17 DIAGNOSIS — I10 Essential (primary) hypertension: Secondary | ICD-10-CM | POA: Diagnosis not present

## 2014-03-22 DIAGNOSIS — E119 Type 2 diabetes mellitus without complications: Secondary | ICD-10-CM | POA: Diagnosis not present

## 2014-03-22 DIAGNOSIS — E7800 Pure hypercholesterolemia, unspecified: Secondary | ICD-10-CM | POA: Diagnosis not present

## 2014-03-22 DIAGNOSIS — I10 Essential (primary) hypertension: Secondary | ICD-10-CM | POA: Diagnosis not present

## 2014-07-23 DIAGNOSIS — E119 Type 2 diabetes mellitus without complications: Secondary | ICD-10-CM | POA: Diagnosis not present

## 2014-07-23 DIAGNOSIS — E780 Pure hypercholesterolemia: Secondary | ICD-10-CM | POA: Diagnosis not present

## 2014-07-23 DIAGNOSIS — I10 Essential (primary) hypertension: Secondary | ICD-10-CM | POA: Diagnosis not present

## 2014-07-23 DIAGNOSIS — R634 Abnormal weight loss: Secondary | ICD-10-CM | POA: Diagnosis not present

## 2014-11-23 DIAGNOSIS — E119 Type 2 diabetes mellitus without complications: Secondary | ICD-10-CM | POA: Diagnosis not present

## 2014-11-23 DIAGNOSIS — E780 Pure hypercholesterolemia: Secondary | ICD-10-CM | POA: Diagnosis not present

## 2014-11-23 DIAGNOSIS — Z79899 Other long term (current) drug therapy: Secondary | ICD-10-CM | POA: Diagnosis not present

## 2014-11-23 DIAGNOSIS — Z9181 History of falling: Secondary | ICD-10-CM | POA: Diagnosis not present

## 2014-11-23 DIAGNOSIS — I10 Essential (primary) hypertension: Secondary | ICD-10-CM | POA: Diagnosis not present

## 2015-02-23 ENCOUNTER — Encounter (HOSPITAL_COMMUNITY): Payer: Self-pay | Admitting: Emergency Medicine

## 2015-02-23 DIAGNOSIS — I10 Essential (primary) hypertension: Secondary | ICD-10-CM | POA: Diagnosis not present

## 2015-02-23 DIAGNOSIS — E785 Hyperlipidemia, unspecified: Secondary | ICD-10-CM | POA: Diagnosis not present

## 2015-02-23 DIAGNOSIS — Z7982 Long term (current) use of aspirin: Secondary | ICD-10-CM | POA: Diagnosis not present

## 2015-02-23 DIAGNOSIS — E119 Type 2 diabetes mellitus without complications: Secondary | ICD-10-CM | POA: Diagnosis not present

## 2015-02-23 DIAGNOSIS — R531 Weakness: Secondary | ICD-10-CM | POA: Insufficient documentation

## 2015-02-23 DIAGNOSIS — E780 Pure hypercholesterolemia: Secondary | ICD-10-CM | POA: Insufficient documentation

## 2015-02-23 DIAGNOSIS — Z79899 Other long term (current) drug therapy: Secondary | ICD-10-CM | POA: Insufficient documentation

## 2015-02-23 LAB — CBC WITH DIFFERENTIAL/PLATELET
BASOS ABS: 0 10*3/uL (ref 0.0–0.1)
Basophils Relative: 0 % (ref 0–1)
EOS ABS: 0.2 10*3/uL (ref 0.0–0.7)
EOS PCT: 3 % (ref 0–5)
HCT: 40.2 % (ref 36.0–46.0)
Hemoglobin: 13.1 g/dL (ref 12.0–15.0)
LYMPHS ABS: 1.8 10*3/uL (ref 0.7–4.0)
LYMPHS PCT: 32 % (ref 12–46)
MCH: 31.3 pg (ref 26.0–34.0)
MCHC: 32.6 g/dL (ref 30.0–36.0)
MCV: 95.9 fL (ref 78.0–100.0)
Monocytes Absolute: 0.5 10*3/uL (ref 0.1–1.0)
Monocytes Relative: 9 % (ref 3–12)
NEUTROS PCT: 56 % (ref 43–77)
Neutro Abs: 3.2 10*3/uL (ref 1.7–7.7)
PLATELETS: 197 10*3/uL (ref 150–400)
RBC: 4.19 MIL/uL (ref 3.87–5.11)
RDW: 12.8 % (ref 11.5–15.5)
WBC: 5.7 10*3/uL (ref 4.0–10.5)

## 2015-02-23 LAB — COMPREHENSIVE METABOLIC PANEL
ALBUMIN: 3.3 g/dL — AB (ref 3.5–5.0)
ALK PHOS: 56 U/L (ref 38–126)
ALT: 14 U/L (ref 14–54)
ANION GAP: 7 (ref 5–15)
AST: 19 U/L (ref 15–41)
BUN: 12 mg/dL (ref 6–20)
CO2: 29 mmol/L (ref 22–32)
CREATININE: 0.98 mg/dL (ref 0.44–1.00)
Calcium: 9.4 mg/dL (ref 8.9–10.3)
Chloride: 104 mmol/L (ref 101–111)
GFR calc Af Amer: 55 mL/min — ABNORMAL LOW (ref >60)
GFR calc non Af Amer: 48 mL/min — ABNORMAL LOW (ref >60)
Glucose, Bld: 175 mg/dL — ABNORMAL HIGH (ref 65–99)
Potassium: 3.5 mmol/L (ref 3.5–5.1)
Sodium: 140 mmol/L (ref 135–145)
TOTAL PROTEIN: 6.9 g/dL (ref 6.5–8.1)
Total Bilirubin: 0.3 mg/dL (ref 0.3–1.2)

## 2015-02-23 NOTE — ED Notes (Signed)
Pt. reports generalized weakness and lightheaded onset this evening , respirations unlabored , no fever or chills.

## 2015-02-24 ENCOUNTER — Emergency Department (HOSPITAL_COMMUNITY)
Admission: EM | Admit: 2015-02-24 | Discharge: 2015-02-24 | Disposition: A | Discharge status: Home / Self Care | Payer: Medicare Other | Attending: Emergency Medicine | Admitting: Emergency Medicine

## 2015-02-24 DIAGNOSIS — R030 Elevated blood-pressure reading, without diagnosis of hypertension: Secondary | ICD-10-CM

## 2015-02-24 DIAGNOSIS — Z1389 Encounter for screening for other disorder: Secondary | ICD-10-CM | POA: Diagnosis not present

## 2015-02-24 DIAGNOSIS — I10 Essential (primary) hypertension: Secondary | ICD-10-CM | POA: Diagnosis not present

## 2015-02-24 DIAGNOSIS — R531 Weakness: Secondary | ICD-10-CM

## 2015-02-24 DIAGNOSIS — Z6833 Body mass index (BMI) 33.0-33.9, adult: Secondary | ICD-10-CM | POA: Diagnosis not present

## 2015-02-24 LAB — URINE MICROSCOPIC-ADD ON

## 2015-02-24 LAB — URINALYSIS, ROUTINE W REFLEX MICROSCOPIC
Bilirubin Urine: NEGATIVE
GLUCOSE, UA: NEGATIVE mg/dL
Hgb urine dipstick: NEGATIVE
KETONES UR: NEGATIVE mg/dL
Nitrite: NEGATIVE
PH: 5 (ref 5.0–8.0)
PROTEIN: NEGATIVE mg/dL
SPECIFIC GRAVITY, URINE: 1.019 (ref 1.005–1.030)
UROBILINOGEN UA: 0.2 mg/dL (ref 0.0–1.0)

## 2015-02-24 NOTE — Discharge Instructions (Signed)
Hypertension °Hypertension, commonly called high blood pressure, is when the force of blood pumping through your arteries is too strong. Your arteries are the blood vessels that carry blood from your heart throughout your body. A blood pressure reading consists of a higher number over a lower number, such as 110/72. The higher number (systolic) is the pressure inside your arteries when your heart pumps. The lower number (diastolic) is the pressure inside your arteries when your heart relaxes. Ideally you want your blood pressure below 120/80. °Hypertension forces your heart to work harder to pump blood. Your arteries may become narrow or stiff. Having hypertension puts you at risk for heart disease, stroke, and other problems.  °RISK FACTORS °Some risk factors for high blood pressure are controllable. Others are not.  °Risk factors you cannot control include:  °· Race. You may be at higher risk if you are African American. °· Age. Risk increases with age. °· Gender. Men are at higher risk than women before age 45 years. After age 65, women are at higher risk than men. °Risk factors you can control include: °· Not getting enough exercise or physical activity. °· Being overweight. °· Getting too much fat, sugar, calories, or salt in your diet. °· Drinking too much alcohol. °SIGNS AND SYMPTOMS °Hypertension does not usually cause signs or symptoms. Extremely high blood pressure (hypertensive crisis) may cause headache, anxiety, shortness of breath, and nosebleed. °DIAGNOSIS  °To check if you have hypertension, your health care provider will measure your blood pressure while you are seated, with your arm held at the level of your heart. It should be measured at least twice using the same arm. Certain conditions can cause a difference in blood pressure between your right and left arms. A blood pressure reading that is higher than normal on one occasion does not mean that you need treatment. If one blood pressure reading  is high, ask your health care provider about having it checked again. °TREATMENT  °Treating high blood pressure includes making lifestyle changes and possibly taking medicine. Living a healthy lifestyle can help lower high blood pressure. You may need to change some of your habits. °Lifestyle changes may include: °· Following the DASH diet. This diet is high in fruits, vegetables, and whole grains. It is low in salt, red meat, and added sugars. °· Getting at least 2½ hours of brisk physical activity every week. °· Losing weight if necessary. °· Not smoking. °· Limiting alcoholic beverages. °· Learning ways to reduce stress. ° If lifestyle changes are not enough to get your blood pressure under control, your health care provider may prescribe medicine. You may need to take more than one. Work closely with your health care provider to understand the risks and benefits. °HOME CARE INSTRUCTIONS °· Have your blood pressure rechecked as directed by your health care provider.   °· Take medicines only as directed by your health care provider. Follow the directions carefully. Blood pressure medicines must be taken as prescribed. The medicine does not work as well when you skip doses. Skipping doses also puts you at risk for problems.   °· Do not smoke.   °· Monitor your blood pressure at home as directed by your health care provider.  °SEEK MEDICAL CARE IF:  °· You think you are having a reaction to medicines taken. °· You have recurrent headaches or feel dizzy. °· You have swelling in your ankles. °· You have trouble with your vision. °SEEK IMMEDIATE MEDICAL CARE IF: °· You develop a severe headache or confusion. °·   You have unusual weakness, numbness, or feel faint. °· You have severe chest or abdominal pain. °· You vomit repeatedly. °· You have trouble breathing. °MAKE SURE YOU:  °· Understand these instructions. °· Will watch your condition. °· Will get help right away if you are not doing well or get worse. °Document  Released: 08/27/2005 Document Revised: 01/11/2014 Document Reviewed: 06/19/2013 °[employer]® Patient Information ©2015 [employer]. This information is not intended to replace advice given to you by your health care provider. Make sure you discuss any questions you have with your health care provider. °Weakness °Weakness is a lack of strength. It may be felt all over the body (generalized) or in one specific part of the body (focal). Some causes of weakness can be serious. You may need further medical evaluation, especially if you are elderly or you have a history of immunosuppression (such as chemotherapy or HIV), kidney disease, heart disease, or diabetes. °CAUSES  °Weakness can be caused by many different things, including: °· Infection. °· Physical exhaustion. °· Internal bleeding or other blood loss that results in a lack of red blood cells (anemia). °· Dehydration. This cause is more common in elderly people. °· Side effects or electrolyte abnormalities from medicines, such as pain medicines or sedatives. °· Emotional distress, anxiety, or depression. °· Circulation problems, especially severe peripheral arterial disease. °· Heart disease, such as rapid atrial fibrillation, bradycardia, or heart failure. °· Nervous system disorders, such as Guillain-Barré syndrome, multiple sclerosis, or stroke. °DIAGNOSIS  °To find the cause of your weakness, your caregiver will take your history and perform a physical exam. Lab tests or X-rays may also be ordered, if needed. °TREATMENT  °Treatment of weakness depends on the cause of your symptoms and can vary greatly. °HOME CARE INSTRUCTIONS  °· Rest as needed. °· Eat a well-balanced diet. °· Try to get some exercise every day. °· Only take over-the-counter or prescription medicines as directed by your caregiver. °SEEK MEDICAL CARE IF:  °· Your weakness seems to be getting worse or spreads to other parts of your body. °· You develop new aches or pains. °SEEK IMMEDIATE  MEDICAL CARE IF:  °· You cannot perform your normal daily activities, such as getting dressed and feeding yourself. °· You cannot walk up and down stairs, or you feel exhausted when you do so. °· You have shortness of breath or chest pain. °· You have difficulty moving parts of your body. °· You have weakness in only one area of the body or on only one side of the body. °· You have a fever. °· You have trouble speaking or swallowing. °· You cannot control your bladder or bowel movements. °· You have black or bloody vomit or stools. °MAKE SURE YOU: °· Understand these instructions. °· Will watch your condition. °· Will get help right away if you are not doing well or get worse. °Document Released: 08/27/2005 Document Revised: 02/26/2012 Document Reviewed: 10/26/2011 °[employer]® Patient Information ©2015 [employer]. This information is not intended to replace advice given to you by your health care provider. Make sure you discuss any questions you have with your health care provider. ° °

## 2015-02-24 NOTE — ED Notes (Signed)
Main lab called requesting recollect of urine

## 2015-02-24 NOTE — ED Provider Notes (Signed)
TIME SEEN: 2:07 AM  CHIEF COMPLAINT: Generalized weakness  HPI:  HPI Comments: Kristy Torres is a 81 y.o. female with hx DM, HTN, HLD who presents to the Emergency Department complaining of generalized weakness and dizziness that occurred this evening. The dizziness lasts a few seconds before subsiding. She mentions that she felt like her blood pressure was high but did not take it prior to coming to the ED. She cannot say what her blood pressure normally runs. She admits to being complaint with hypertension medication. Pt believes she has stayed well hydrated today. Pt is currently asymptomatic. Family notes chronic intermittent diarrhea but denies any changes.  Denies fever, chest pain, shortness of breath, headache, nausea, vomiting, numbness, paresthesia, or any other associated symptoms.  PCP - Anna Genre  ROS: See HPI Constitutional: generalized weakness. no fever  Eyes: no drainage  ENT: no runny nose   Cardiovascular:  no chest pain  Resp: no SOB  GI: no nausea, vomiting GU: no dysuria Integumentary: no rash  Allergy: no hives  Musculoskeletal: no leg swelling  Neurological: dizziness. no slurred speech, headache.  ROS otherwise negative  PAST MEDICAL HISTORY/PAST SURGICAL HISTORY:  Past Medical History  Diagnosis Date  . Diabetes mellitus   . Hypertension   . Hypokalemia   . High cholesterol     MEDICATIONS:  Prior to Admission medications   Medication Sig Start Date End Date Taking? Authorizing Provider  aspirin 325 MG EC tablet Take 325 mg by mouth daily.    Historical Provider, MD  atenolol (TENORMIN) 50 MG tablet Take 50 mg by mouth daily.    Historical Provider, MD  atorvastatin (LIPITOR) 10 MG tablet Take 10 mg by mouth at bedtime.     Historical Provider, MD  carbamide peroxide (DEBROX) 6.5 % otic solution Place 5 drops in ear(s) 2 (two) times daily. 02/15/13   Raeford Razor, MD  glipiZIDE (GLUCOTROL XL) 2.5 MG 24 hr tablet Take 2.5 mg by mouth daily.    Historical  Provider, MD  losartan-hydrochlorothiazide (HYZAAR) 100-25 MG per tablet Take 1 tablet by mouth daily.    Historical Provider, MD  potassium chloride SA (K-DUR,KLOR-CON) 20 MEQ tablet Take 40 mEq by mouth daily.     Historical Provider, MD    ALLERGIES:  No Known Allergies  SOCIAL HISTORY:  History  Substance Use Topics  . Smoking status: Never Smoker   . Smokeless tobacco: Not on file  . Alcohol Use: No    FAMILY HISTORY: No family history on file.  EXAM: Triage Vitals: BP 159/78 mmHg  Pulse 65  Temp(Src) 98.4 F (36.9 C)  Resp 13  SpO2 100%   CONSTITUTIONAL: Alert and oriented and responds appropriately to questions. Well-appearing; well-nourished, elderly, smiling, in no distress HEAD: Normocephalic EYES: Conjunctivae clear, PERRL ENT: normal nose; no rhinorrhea; moist mucous membranes; pharynx without lesions noted NECK: Supple, no meningismus, no LAD  CARD: RRR; S1 and S2 appreciated; no murmurs, no clicks, no rubs, no gallops RESP: Normal chest excursion without splinting or tachypnea; breath sounds clear and equal bilaterally; no wheezes, no rhonchi, no rales, no hypoxia or respiratory distress, speaking full sentences ABD/GI: Normal bowel sounds; non-distended; soft, non-tender, no rebound, no guarding, no peritoneal signs BACK:  The back appears normal and is non-tender to palpation, there is no CVA tenderness EXT: Normal ROM in all joints; non-tender to palpation; no edema; normal capillary refill; no cyanosis, no calf tenderness or swelling    SKIN: Normal color for age and race; warm  NEURO: Moves all extremities equally, sensation to light touch intact diffusely, cranial nerves II through XII intact. Strength 5/5 in all 4 extremities.  PSYCH: The patient's mood and manner are appropriate. Grooming and personal hygiene are appropriate.  MEDICAL DECISION MAKING: Patient here with dizziness, generalized weakness. She has having intermittent hypertension but this will  resolve spontaneously and her blood pressure come back down just as quickly as it went. She reports compliance with her anti-hypertensive medications. Will obtain labs, urine, EKG and monitor patient.  ED PROGRESS: Patient's labs are unremarkable. Urine shows trace leukocytes and many bacteria but also a few squamous cells. This may be a dirty catch. She denies any urinary symptoms. EKG shows no new ischemic changes. No arrhythmia. She has been able to ambulate in the ED without any difficulty. I feel she is safe to be discharged home. Have advised her to keep a log of her blood pressure and present this to her primary care physician. I do not feel she needs to be started on any other blood pressure medications today as her blood pressures were dropped to the 150s/70s without intervention in the ED. Discussed return precautions. She verbalized understanding and is comfortable with this plan.    EKG Interpretation  Date/Time:  Thursday February 24 2015 02:46:01 EDT Ventricular Rate:  68 PR Interval:  154 QRS Duration: 129 QT Interval:  442 QTC Calculation: 470 R Axis:   -13 Text Interpretation:  Sinus rhythm Right bundle branch block Inferior infarct, age indeterminate No significant change since last tracing in 2013 Confirmed by WARD,  DO, KRISTEN [phone]) on 02/24/2015 2:53:20 AM         I personally performed the services described in this documentation, which was scribed in my presence. The recorded information has been reviewed and is accurate.    Layla Maw Ward, DO 02/24/15 [phone]

## 2015-03-29 DIAGNOSIS — Z6834 Body mass index (BMI) 34.0-34.9, adult: Secondary | ICD-10-CM | POA: Diagnosis not present

## 2015-03-29 DIAGNOSIS — Z9181 History of falling: Secondary | ICD-10-CM | POA: Diagnosis not present

## 2015-03-29 DIAGNOSIS — E119 Type 2 diabetes mellitus without complications: Secondary | ICD-10-CM | POA: Diagnosis not present

## 2015-03-29 DIAGNOSIS — Z1389 Encounter for screening for other disorder: Secondary | ICD-10-CM | POA: Diagnosis not present

## 2015-03-29 DIAGNOSIS — I10 Essential (primary) hypertension: Secondary | ICD-10-CM | POA: Diagnosis not present

## 2015-03-29 DIAGNOSIS — E780 Pure hypercholesterolemia: Secondary | ICD-10-CM | POA: Diagnosis not present

## 2015-07-29 DIAGNOSIS — E7800 Pure hypercholesterolemia, unspecified: Secondary | ICD-10-CM | POA: Diagnosis not present

## 2015-07-29 DIAGNOSIS — E119 Type 2 diabetes mellitus without complications: Secondary | ICD-10-CM | POA: Diagnosis not present

## 2015-07-29 DIAGNOSIS — I10 Essential (primary) hypertension: Secondary | ICD-10-CM | POA: Diagnosis not present

## 2015-07-29 DIAGNOSIS — Z79899 Other long term (current) drug therapy: Secondary | ICD-10-CM | POA: Diagnosis not present

## 2015-12-05 ENCOUNTER — Encounter (HOSPITAL_COMMUNITY): Payer: Self-pay | Admitting: *Deleted

## 2015-12-05 ENCOUNTER — Emergency Department (HOSPITAL_COMMUNITY)
Admission: EM | Admit: 2015-12-05 | Discharge: 2015-12-06 | Disposition: A | Discharge status: Home / Self Care | Payer: Medicare Other | Attending: Emergency Medicine | Admitting: Emergency Medicine

## 2015-12-05 ENCOUNTER — Emergency Department (HOSPITAL_COMMUNITY): Payer: Medicare Other

## 2015-12-05 DIAGNOSIS — E7800 Pure hypercholesterolemia, unspecified: Secondary | ICD-10-CM | POA: Insufficient documentation

## 2015-12-05 DIAGNOSIS — R011 Cardiac murmur, unspecified: Secondary | ICD-10-CM | POA: Insufficient documentation

## 2015-12-05 DIAGNOSIS — G9389 Other specified disorders of brain: Secondary | ICD-10-CM | POA: Diagnosis not present

## 2015-12-05 DIAGNOSIS — E119 Type 2 diabetes mellitus without complications: Secondary | ICD-10-CM | POA: Diagnosis not present

## 2015-12-05 DIAGNOSIS — Z7982 Long term (current) use of aspirin: Secondary | ICD-10-CM | POA: Insufficient documentation

## 2015-12-05 DIAGNOSIS — Z79899 Other long term (current) drug therapy: Secondary | ICD-10-CM | POA: Diagnosis not present

## 2015-12-05 DIAGNOSIS — I160 Hypertensive urgency: Secondary | ICD-10-CM

## 2015-12-05 DIAGNOSIS — E876 Hypokalemia: Secondary | ICD-10-CM | POA: Insufficient documentation

## 2015-12-05 DIAGNOSIS — J9811 Atelectasis: Secondary | ICD-10-CM | POA: Diagnosis not present

## 2015-12-05 DIAGNOSIS — R51 Headache: Secondary | ICD-10-CM | POA: Diagnosis present

## 2015-12-05 LAB — CBC WITH DIFFERENTIAL/PLATELET
BASOS ABS: 0 10*3/uL (ref 0.0–0.1)
Basophils Relative: 1 %
EOS PCT: 3 %
Eosinophils Absolute: 0.2 10*3/uL (ref 0.0–0.7)
HCT: 40.5 % (ref 36.0–46.0)
Hemoglobin: 12.9 g/dL (ref 12.0–15.0)
LYMPHS ABS: 2.1 10*3/uL (ref 0.7–4.0)
LYMPHS PCT: 33 %
MCH: 31.2 pg (ref 26.0–34.0)
MCHC: 31.9 g/dL (ref 30.0–36.0)
MCV: 97.8 fL (ref 78.0–100.0)
MONO ABS: 0.7 10*3/uL (ref 0.1–1.0)
MONOS PCT: 11 %
Neutro Abs: 3.5 10*3/uL (ref 1.7–7.7)
Neutrophils Relative %: 54 %
PLATELETS: 195 10*3/uL (ref 150–400)
RBC: 4.14 MIL/uL (ref 3.87–5.11)
RDW: 12.9 % (ref 11.5–15.5)
WBC: 6.4 10*3/uL (ref 4.0–10.5)

## 2015-12-05 LAB — COMPREHENSIVE METABOLIC PANEL
ALBUMIN: 3.3 g/dL — AB (ref 3.5–5.0)
ALT: 14 U/L (ref 14–54)
AST: 17 U/L (ref 15–41)
Alkaline Phosphatase: 51 U/L (ref 38–126)
Anion gap: 9 (ref 5–15)
BILIRUBIN TOTAL: 0.3 mg/dL (ref 0.3–1.2)
BUN: 14 mg/dL (ref 6–20)
CO2: 27 mmol/L (ref 22–32)
Calcium: 9.2 mg/dL (ref 8.9–10.3)
Chloride: 104 mmol/L (ref 101–111)
Creatinine, Ser: 1.03 mg/dL — ABNORMAL HIGH (ref 0.44–1.00)
GFR calc Af Amer: 52 mL/min — ABNORMAL LOW (ref >60)
GFR calc non Af Amer: 45 mL/min — ABNORMAL LOW (ref >60)
GLUCOSE: 141 mg/dL — AB (ref 65–99)
POTASSIUM: 4.1 mmol/L (ref 3.5–5.1)
Sodium: 140 mmol/L (ref 135–145)
TOTAL PROTEIN: 6.1 g/dL — AB (ref 6.5–8.1)

## 2015-12-05 LAB — URINALYSIS, ROUTINE W REFLEX MICROSCOPIC
BILIRUBIN URINE: NEGATIVE
GLUCOSE, UA: NEGATIVE mg/dL
HGB URINE DIPSTICK: NEGATIVE
KETONES UR: NEGATIVE mg/dL
Nitrite: NEGATIVE
PROTEIN: NEGATIVE mg/dL
Specific Gravity, Urine: 1.008 (ref 1.005–1.030)
pH: 6 (ref 5.0–8.0)

## 2015-12-05 LAB — URINE MICROSCOPIC-ADD ON

## 2015-12-05 MED ORDER — METOPROLOL TARTRATE 1 MG/ML IV SOLN
5.0000 mg | Freq: Once | INTRAVENOUS | Status: AC
  Administered 2015-12-05: 5 mg via INTRAVENOUS
  Filled 2015-12-05: qty 5

## 2015-12-05 MED ORDER — ATENOLOL 50 MG PO TABS: 75.0000 mg | ORAL_TABLET | Freq: Every day | ORAL | Status: DC

## 2015-12-05 NOTE — Discharge Instructions (Signed)
As discussed, tonight's evaluation has been largely reassuring.  However, with your new medication regimen changes, it is important that you follow-up with your primary care physician to insure that you are improving.  Return here for concerning changes in your condition.

## 2015-12-05 NOTE — ED Notes (Signed)
Pt reports having sudden onset of sharp pain to top of her head approx 2 hours ago, episode lasted only approx 1-2 mins and has since completely resolved. No neuro deficits noted at triage.

## 2015-12-05 NOTE — ED Notes (Signed)
Pt stable, ambulatory, states understanding of discharge instructions 

## 2015-12-05 NOTE — ED Provider Notes (Signed)
CSN: [id number]     Arrival date & time 12/05/15  1647 History   First MD Initiated Contact with Patient 12/05/15 2007     Chief Complaint  Patient presents with  . Headache     (Consider location/radiation/quality/duration/timing/severity/associated sxs/prior Treatment) HPI Patient presents with concern of headache, resolved. Patient has occasional headaches, today had a brief period of focal pain, moderate on the scalp. Symptoms are not entirely typical, but with concern NG tube blood pressure measurement taken after the headache resolved, the patient presents for evaluation. Currently she denies any pain, discomfort, nausea, lightheadedness. She states that she has been in her usual state of health. No recent medication changes, diet changes. Patient takes atenolol, Cozaar regularly.   Past Medical History  Diagnosis Date  . Diabetes mellitus   . Hypertension   . Hypokalemia   . High cholesterol    History reviewed. No pertinent past surgical history. History reviewed. No pertinent family history. Social History  Substance Use Topics  . Smoking status: Never Smoker   . Smokeless tobacco: None  . Alcohol Use: No   OB History    No data available     Review of Systems  Constitutional:       Per HPI, otherwise negative  HENT:       Per HPI, otherwise negative  Respiratory:       Per HPI, otherwise negative  Cardiovascular:       Per HPI, otherwise negative  Gastrointestinal: Negative for vomiting.  Endocrine:       Negative aside from HPI  Genitourinary:       Neg aside from HPI   Musculoskeletal:       Per HPI, otherwise negative  Skin: Negative.   Neurological: Positive for headaches. Negative for syncope.      Allergies  Review of patient's allergies indicates no known allergies.  Home Medications   Prior to Admission medications   Medication Sig Start Date End Date Taking? Authorizing Provider  aspirin 325 MG EC tablet Take 325 mg by mouth  daily.    Historical Provider, MD  atenolol (TENORMIN) 50 MG tablet Take 50 mg by mouth daily.    Historical Provider, MD  atorvastatin (LIPITOR) 10 MG tablet Take 10 mg by mouth at bedtime.     Historical Provider, MD  carbamide peroxide (DEBROX) 6.5 % otic solution Place 5 drops in ear(s) 2 (two) times daily. 02/15/13   Raeford RazorStephen Kohut, MD  glipiZIDE (GLUCOTROL XL) 2.5 MG 24 hr tablet Take 2.5 mg by mouth daily.    Historical Provider, MD  losartan-hydrochlorothiazide (HYZAAR) 100-25 MG per tablet Take 1 tablet by mouth daily.    Historical Provider, MD  potassium chloride SA (K-DUR,KLOR-CON) 20 MEQ tablet Take 40 mEq by mouth daily.     Historical Provider, MD   BP 231/84 mmHg  Pulse 73  Temp(Src) 98.2 F (36.8 C) (Oral)  Resp 16  SpO2 100% Physical Exam  Constitutional: She is oriented to person, place, and time. She appears well-developed and well-nourished. No distress.  Elderly female awake, alert, interacting appropriately in no distress  HENT:  Head: Normocephalic and atraumatic.  Eyes: Conjunctivae and EOM are normal.  Cardiovascular: Normal rate and regular rhythm.   Murmur heard. Pulmonary/Chest: Effort normal and breath sounds normal. No stridor. No respiratory distress.  Abdominal: She exhibits no distension.  Musculoskeletal: She exhibits no edema.  Neurological: She is alert and oriented to person, place, and time. No cranial nerve deficit.  Poor hearing,  otherwise cranial nerves unremarkable, peripheral nerves unremarkable, patient moves all extremity spontaneously  Skin: Skin is warm and dry.  Psychiatric: She has a normal mood and affect.  Nursing note and vitals reviewed.   ED Course  Procedures (including critical care time) Labs Review Labs Reviewed  COMPREHENSIVE METABOLIC PANEL - Abnormal; Notable for the following:    Glucose, Bld 141 (*)    Creatinine, Ser 1.03 (*)    Total Protein 6.1 (*)    Albumin 3.3 (*)    GFR calc non Af Amer 45 (*)    GFR calc Af  Amer 52 (*)    All other components within normal limits  URINALYSIS, ROUTINE W REFLEX MICROSCOPIC (NOT AT [facility]) - Abnormal; Notable for the following:    APPearance CLOUDY (*)    Leukocytes, UA TRACE (*)    All other components within normal limits  URINE MICROSCOPIC-ADD ON - Abnormal; Notable for the following:    Squamous Epithelial / LPF 0-5 (*)    Bacteria, UA RARE (*)    All other components within normal limits  CBC WITH DIFFERENTIAL/PLATELET    Imaging Review Dg Chest 2 View  12/05/2015  CLINICAL DATA:  Acute onset of sharp pain at the top of the head. Initial encounter. EXAM: CHEST  2 VIEW COMPARISON:  Chest radiograph from 12/20/2003 FINDINGS: The lungs are well-aerated. Minimal bibasilar atelectasis is noted. There is no evidence of pleural effusion or pneumothorax. The heart is mildly enlarged. No acute osseous abnormalities are seen. IMPRESSION: Mild cardiomegaly. Minimal bibasilar atelectasis noted. Lungs otherwise clear. Electronically Signed   By: Roanna Raider M.D.   On: 12/05/2015 21:48   Ct Head Wo Contrast  12/05/2015  CLINICAL DATA:  Hypertensive crisis. EXAM: CT HEAD WITHOUT CONTRAST TECHNIQUE: Contiguous axial images were obtained from the base of the skull through the vertex without intravenous contrast. COMPARISON:  01/06/2012 head CT. FINDINGS: No evidence of parenchymal hemorrhage or extra-axial fluid collection. No mass lesion, mass effect, or midline shift. No CT evidence of acute infarction. Encephalomalacia in the left occipital lobe is unchanged. Intracranial atherosclerosis. Nonspecific subcortical and periventricular white matter hypodensity, most in keeping with chronic small vessel ischemic change. Diffuse cerebral volume loss. No acute ventriculomegaly. The visualized paranasal sinuses are essentially clear. The mastoid air cells are unopacified. No evidence of calvarial fracture. Partially visualized small lytic osseous lesion in the anterior medial right  maxilla (series 3/image 1). IMPRESSION: 1.  No evidence of acute intracranial abnormality. 2. Stable encephalomalacia in the left occipital lobe. 3. Cerebral volume loss and prominent chronic small vessel ischemic and. 4. Partially visualized small lytic osseous lesion in the anterior right maxilla, incompletely evaluated. If there are clinical symptoms referable to this location or other clinical indications, this could be further evaluated with maxillofacial CT. Electronically Signed   By: Delbert Phenix M.D.   On: 12/05/2015 22:59   I have personally reviewed and evaluated these images and lab results as part of my medical decision-making.   EKG Interpretation   Date/Time:  Monday December 05 2015 20:46:10 EDT Ventricular Rate:  68 PR Interval:  166 QRS Duration: 137 QT Interval:  441 QTC Calculation: 469 R Axis:   2 Text Interpretation:  Sinus rhythm Right bundle branch block Inferior  infarct, age indeterminate Sinus rhythm Right bundle branch block T wave  abnormality Abnormal ekg Confirmed by Gerhard Munch  MD ([id number]) on  12/05/2015 11:26:05 PM     Patient's initial blood pressure is 230/84.  After the  provision of beta blocker, the patient's blood pressure is 190/70  Patient in no distress on repeat exam, no ongoing complaints per She, her daughter and I discussed all findings, ideal of increasing home medication, with outpatient follow-up to ensure that she has continued improvement.  MDM  Elderly female with history of hypertension presents with concern of persistent hypertension, and after a brief headache.  Headache, there is no evidence for ongoing neurologic dysfunction, no evidence for endorgan involvement, and the patient's blood pressure decreases approximately 15% after medication. With no ongoing red flag findings, evidence for distress, the patient was discharged with increased beta blocker medication to follow-up with primary care.  Gerhard Munch, MD 12/05/15 [id number]

## 2015-12-15 DIAGNOSIS — Z6833 Body mass index (BMI) 33.0-33.9, adult: Secondary | ICD-10-CM | POA: Diagnosis not present

## 2015-12-15 DIAGNOSIS — E7800 Pure hypercholesterolemia, unspecified: Secondary | ICD-10-CM | POA: Diagnosis not present

## 2015-12-15 DIAGNOSIS — E119 Type 2 diabetes mellitus without complications: Secondary | ICD-10-CM | POA: Diagnosis not present

## 2015-12-15 DIAGNOSIS — I10 Essential (primary) hypertension: Secondary | ICD-10-CM | POA: Diagnosis not present

## 2015-12-15 DIAGNOSIS — E669 Obesity, unspecified: Secondary | ICD-10-CM | POA: Diagnosis not present

## 2016-01-13 DIAGNOSIS — I10 Essential (primary) hypertension: Secondary | ICD-10-CM | POA: Diagnosis not present

## 2016-01-13 DIAGNOSIS — E119 Type 2 diabetes mellitus without complications: Secondary | ICD-10-CM | POA: Diagnosis not present

## 2016-03-20 DIAGNOSIS — M160 Bilateral primary osteoarthritis of hip: Secondary | ICD-10-CM | POA: Diagnosis not present

## 2016-03-20 DIAGNOSIS — I10 Essential (primary) hypertension: Secondary | ICD-10-CM | POA: Diagnosis not present

## 2016-03-20 DIAGNOSIS — Z6834 Body mass index (BMI) 34.0-34.9, adult: Secondary | ICD-10-CM | POA: Diagnosis not present

## 2016-03-20 DIAGNOSIS — L602 Onychogryphosis: Secondary | ICD-10-CM | POA: Diagnosis not present

## 2016-05-09 ENCOUNTER — Other Ambulatory Visit: Payer: Self-pay

## 2016-07-10 DIAGNOSIS — Z6833 Body mass index (BMI) 33.0-33.9, adult: Secondary | ICD-10-CM | POA: Diagnosis not present

## 2016-07-10 DIAGNOSIS — Z1389 Encounter for screening for other disorder: Secondary | ICD-10-CM | POA: Diagnosis not present

## 2016-07-10 DIAGNOSIS — Z9181 History of falling: Secondary | ICD-10-CM | POA: Diagnosis not present

## 2016-07-10 DIAGNOSIS — E119 Type 2 diabetes mellitus without complications: Secondary | ICD-10-CM | POA: Diagnosis not present

## 2016-07-10 DIAGNOSIS — M79604 Pain in right leg: Secondary | ICD-10-CM | POA: Diagnosis not present

## 2016-07-10 DIAGNOSIS — E7800 Pure hypercholesterolemia, unspecified: Secondary | ICD-10-CM | POA: Diagnosis not present

## 2016-07-10 DIAGNOSIS — R269 Unspecified abnormalities of gait and mobility: Secondary | ICD-10-CM | POA: Diagnosis not present

## 2016-07-10 DIAGNOSIS — I10 Essential (primary) hypertension: Secondary | ICD-10-CM | POA: Diagnosis not present

## 2016-09-08 ENCOUNTER — Encounter (HOSPITAL_COMMUNITY): Payer: Self-pay

## 2016-09-08 ENCOUNTER — Emergency Department (HOSPITAL_COMMUNITY)
Admission: EM | Admit: 2016-09-08 | Discharge: 2016-09-08 | Disposition: A | Discharge status: Home Health | Payer: Medicare Other | Attending: Emergency Medicine | Admitting: Emergency Medicine

## 2016-09-08 ENCOUNTER — Emergency Department (HOSPITAL_COMMUNITY): Payer: Medicare Other

## 2016-09-08 DIAGNOSIS — Z7982 Long term (current) use of aspirin: Secondary | ICD-10-CM | POA: Insufficient documentation

## 2016-09-08 DIAGNOSIS — R251 Tremor, unspecified: Secondary | ICD-10-CM | POA: Diagnosis not present

## 2016-09-08 DIAGNOSIS — R791 Abnormal coagulation profile: Secondary | ICD-10-CM | POA: Insufficient documentation

## 2016-09-08 DIAGNOSIS — E119 Type 2 diabetes mellitus without complications: Secondary | ICD-10-CM | POA: Insufficient documentation

## 2016-09-08 DIAGNOSIS — I10 Essential (primary) hypertension: Secondary | ICD-10-CM | POA: Insufficient documentation

## 2016-09-08 DIAGNOSIS — R42 Dizziness and giddiness: Secondary | ICD-10-CM | POA: Diagnosis not present

## 2016-09-08 DIAGNOSIS — Z7984 Long term (current) use of oral hypoglycemic drugs: Secondary | ICD-10-CM | POA: Insufficient documentation

## 2016-09-08 LAB — COMPREHENSIVE METABOLIC PANEL
ALK PHOS: 49 U/L (ref 38–126)
ALT: 16 U/L (ref 14–54)
ANION GAP: 7 (ref 5–15)
AST: 18 U/L (ref 15–41)
Albumin: 3.3 g/dL — ABNORMAL LOW (ref 3.5–5.0)
BUN: 12 mg/dL (ref 6–20)
CALCIUM: 9.5 mg/dL (ref 8.9–10.3)
CO2: 28 mmol/L (ref 22–32)
CREATININE: 1.04 mg/dL — AB (ref 0.44–1.00)
Chloride: 103 mmol/L (ref 101–111)
GFR, EST AFRICAN AMERICAN: 51 mL/min — AB (ref >60)
GFR, EST NON AFRICAN AMERICAN: 44 mL/min — AB (ref >60)
Glucose, Bld: 154 mg/dL — ABNORMAL HIGH (ref 65–99)
Potassium: 3.6 mmol/L (ref 3.5–5.1)
Sodium: 138 mmol/L (ref 135–145)
TOTAL PROTEIN: 6.6 g/dL (ref 6.5–8.1)
Total Bilirubin: 0.6 mg/dL (ref 0.3–1.2)

## 2016-09-08 LAB — CBC
HEMATOCRIT: 41.3 % (ref 36.0–46.0)
Hemoglobin: 13.7 g/dL (ref 12.0–15.0)
MCH: 31.8 pg (ref 26.0–34.0)
MCHC: 33.2 g/dL (ref 30.0–36.0)
MCV: 95.8 fL (ref 78.0–100.0)
PLATELETS: 189 10*3/uL (ref 150–400)
RBC: 4.31 MIL/uL (ref 3.87–5.11)
RDW: 12.4 % (ref 11.5–15.5)
WBC: 5.4 10*3/uL (ref 4.0–10.5)

## 2016-09-08 LAB — I-STAT TROPONIN, ED: TROPONIN I, POC: 0 ng/mL (ref 0.00–0.08)

## 2016-09-08 LAB — DIFFERENTIAL
BASOS PCT: 0 %
Basophils Absolute: 0 10*3/uL (ref 0.0–0.1)
EOS PCT: 3 %
Eosinophils Absolute: 0.2 10*3/uL (ref 0.0–0.7)
LYMPHS PCT: 28 %
Lymphs Abs: 1.5 10*3/uL (ref 0.7–4.0)
MONO ABS: 0.4 10*3/uL (ref 0.1–1.0)
MONOS PCT: 8 %
NEUTROS ABS: 3.3 10*3/uL (ref 1.7–7.7)
Neutrophils Relative %: 61 %

## 2016-09-08 LAB — I-STAT CHEM 8, ED
BUN: 14 mg/dL (ref 6–20)
CHLORIDE: 99 mmol/L — AB (ref 101–111)
CREATININE: 1 mg/dL (ref 0.44–1.00)
Calcium, Ion: 1.23 mmol/L (ref 1.15–1.40)
GLUCOSE: 149 mg/dL — AB (ref 65–99)
HCT: 41 % (ref 36.0–46.0)
HEMOGLOBIN: 13.9 g/dL (ref 12.0–15.0)
POTASSIUM: 3.6 mmol/L (ref 3.5–5.1)
Sodium: 140 mmol/L (ref 135–145)
TCO2: 29 mmol/L (ref 0–100)

## 2016-09-08 LAB — CBG MONITORING, ED: Glucose-Capillary: 161 mg/dL — ABNORMAL HIGH (ref 65–99)

## 2016-09-08 LAB — PROTIME-INR
INR: 0.95
PROTHROMBIN TIME: 12.7 s (ref 11.4–15.2)

## 2016-09-08 LAB — APTT: aPTT: 34 seconds (ref 24–36)

## 2016-09-08 NOTE — ED Provider Notes (Signed)
[facility]-EM[facility] DEPT Provider Note   CSN: [id number] Arrival date & time: 09/08/16  1512     History   Chief Complaint Chief Complaint  Patient presents with  . Shaking    R>L leg shaking    HPI Kristy Torres is a 82 y.o. female.  HPI Patient presents with episodes of her leg shaking. States when she goes to bed at night her right leg shakes. Sometimes left leg to but most of the right leg. No numbness or weakness. Does not affect her at anytime with sats bed. No fevers or chills. Does have a history of hypertension and diabetes. No episodes like this before. No headache. No confusion.  Past Medical History:  Diagnosis Date  . Diabetes mellitus   . High cholesterol   . Hypertension   . Hypokalemia     There are no active problems to display for this patient.   History reviewed. No pertinent surgical history.  OB History    No data available       Home Medications    Prior to Admission medications   Medication Sig Start Date End Date Taking? Authorizing Provider  aspirin 325 MG EC tablet Take 325 mg by mouth daily.    Historical Provider, MD  atenolol (TENORMIN) 50 MG tablet Take 1.5 tablets (75 mg total) by mouth daily. 12/05/15   Gerhard Munchobert Lockwood, MD  atorvastatin (LIPITOR) 10 MG tablet Take 10 mg by mouth at bedtime.     Historical Provider, MD  glipiZIDE (GLUCOTROL XL) 2.5 MG 24 hr tablet Take 2.5 mg by mouth daily.    Historical Provider, MD  losartan-hydrochlorothiazide (HYZAAR) 100-25 MG per tablet Take 1 tablet by mouth daily.    Historical Provider, MD  potassium chloride SA (K-DUR,KLOR-CON) 20 MEQ tablet Take 40 mEq by mouth daily.     Historical Provider, MD    Family History History reviewed. No pertinent family history.  Social History Social History  Substance Use Topics  . Smoking status: Never Smoker  . Smokeless tobacco: Never Used  . Alcohol use No     Allergies   Patient has no known allergies.   Review of Systems Review of  Systems  Constitutional: Negative for appetite change, fever and unexpected weight change.  Respiratory: Negative for shortness of breath.   Cardiovascular: Negative for chest pain.  Gastrointestinal: Negative for abdominal pain.  Musculoskeletal: Negative for back pain, gait problem and neck pain.  Skin: Negative for rash.  Neurological: Positive for tremors. Negative for seizures, speech difficulty and numbness.     Physical Exam Updated Vital Signs BP 183/84   Pulse (!) 54   Temp 98.8 F (37.1 C) (Oral)   Resp 21   Ht 5' (1.524 m)   Wt 178 lb (80.7 kg)   SpO2 100%   BMI 34.76 kg/m   Physical Exam  Constitutional: She is oriented to person, place, and time. She appears well-developed and well-nourished.  Eyes: EOM are normal.  Cardiovascular: Normal rate.   Abdominal: There is no tenderness.  Musculoskeletal: She exhibits edema.  Edema to bilateral lower extremity's.  Neurological: She is alert and oriented to person, place, and time.  Finger-nose intact bilaterally. Heel shin intact bilaterally. Good straight leg raise bilaterally. Sensation intact in both feet.  Skin: Skin is warm.     ED Treatments / Results  Labs (all labs ordered are listed, but only abnormal results are displayed) Labs Reviewed  COMPREHENSIVE METABOLIC PANEL - Abnormal; Notable for the following:  Result Value   Glucose, Bld 154 (*)    Creatinine, Ser 1.04 (*)    Albumin 3.3 (*)    GFR calc non Af Amer 44 (*)    GFR calc Af Amer 51 (*)    All other components within normal limits  CBG MONITORING, ED - Abnormal; Notable for the following:    Glucose-Capillary 161 (*)    All other components within normal limits  I-STAT CHEM 8, ED - Abnormal; Notable for the following:    Chloride 99 (*)    Glucose, Bld 149 (*)    All other components within normal limits  PROTIME-INR  APTT  CBC  DIFFERENTIAL  I-STAT TROPOININ, ED    EKG  EKG Interpretation  Date/Time:  Saturday September 08 2016 15:34:08 EST Ventricular Rate:  63 PR Interval:  156 QRS Duration: 118 QT Interval:  444 QTC Calculation: 454 R Axis:   -44 Text Interpretation:  Normal sinus rhythm Left axis deviation Right bundle branch block Inferior infarct , age undetermined Abnormal ECG Confirmed by Rubin PayorPICKERING  MD, Imer Foxworth [id number]) on 09/08/2016 6:04:46 PM       Radiology Ct Head Wo Contrast  Result Date: 09/08/2016 CLINICAL DATA:  Dizziness. EXAM: CT HEAD WITHOUT CONTRAST TECHNIQUE: Contiguous axial images were obtained from the base of the skull through the vertex without intravenous contrast. COMPARISON:  CT scan of December 05, 2015. FINDINGS: Brain: Mild chronic ischemic white matter disease is noted. Left occipital encephalomalacia is noted consistent with old infarction. Minimal diffuse cortical atrophy is noted. No mass effect or midline shift is noted. Ventricular size is within normal limits. There is no evidence of mass lesion, hemorrhage or acute infarction. Vascular: Atherosclerosis of carotid siphons is noted. Skull: Normal. Negative for fracture or focal lesion. Sinuses/Orbits: No acute finding. Other: None. IMPRESSION: Mild chronic ischemic white matter disease. Old left occipital infarction. Minimal diffuse cortical atrophy. No acute intracranial abnormality seen. Electronically Signed   By: Lupita RaiderJames  Green Jr, M.D.   On: 09/08/2016 16:38    Procedures Procedures (including critical care time)  Medications Ordered in ED Medications - No data to display   Initial Impression / Assessment and Plan / ED Course  I have reviewed the triage vital signs and the nursing notes.  Pertinent labs & imaging results that were available during my care of the patient were reviewed by me and considered in my medical decision making (see chart for details).  Clinical Course     Patient with nighttime tremor shaking in her right leg and sometimes left leg. Nonfocal exam now. Is not happened during the day. Overall  reassuring exam. Does have some hypertension. Does not appear to be a stroke. I think it is reasonable to be discharged follow-up with her primary care doctor. Electrolytes reassuring.  Final Clinical Impressions(s) / ED Diagnoses   Final diagnoses:  Tremor    New Prescriptions New Prescriptions   No medications on file     Benjiman CoreNathan Seanne Chirico, MD 09/08/16 [phone]

## 2016-09-08 NOTE — ED Triage Notes (Signed)
Onset 1 week intermittant leg shaking, R>L and intermittant dizziness.  No change in gait, uses cane for balance.

## 2016-09-12 ENCOUNTER — Emergency Department (HOSPITAL_COMMUNITY): Payer: Medicare Other

## 2016-09-12 ENCOUNTER — Inpatient Hospital Stay (HOSPITAL_COMMUNITY)
Admission: EM | Admit: 2016-09-12 | Discharge: 2016-09-14 | DRG: 948 | Disposition: A | Discharge status: Skilled Nursing Facility | Payer: Medicare Other | Attending: Family Medicine | Admitting: Family Medicine

## 2016-09-12 ENCOUNTER — Encounter (HOSPITAL_COMMUNITY): Payer: Self-pay | Admitting: Emergency Medicine

## 2016-09-12 DIAGNOSIS — Z8249 Family history of ischemic heart disease and other diseases of the circulatory system: Secondary | ICD-10-CM

## 2016-09-12 DIAGNOSIS — R778 Other specified abnormalities of plasma proteins: Secondary | ICD-10-CM | POA: Diagnosis present

## 2016-09-12 DIAGNOSIS — R531 Weakness: Principal | ICD-10-CM | POA: Diagnosis present

## 2016-09-12 DIAGNOSIS — Z79899 Other long term (current) drug therapy: Secondary | ICD-10-CM

## 2016-09-12 DIAGNOSIS — E7800 Pure hypercholesterolemia, unspecified: Secondary | ICD-10-CM | POA: Diagnosis not present

## 2016-09-12 DIAGNOSIS — Z8673 Personal history of transient ischemic attack (TIA), and cerebral infarction without residual deficits: Secondary | ICD-10-CM | POA: Diagnosis not present

## 2016-09-12 DIAGNOSIS — I10 Essential (primary) hypertension: Secondary | ICD-10-CM | POA: Diagnosis not present

## 2016-09-12 DIAGNOSIS — R748 Abnormal levels of other serum enzymes: Secondary | ICD-10-CM | POA: Diagnosis present

## 2016-09-12 DIAGNOSIS — R262 Difficulty in walking, not elsewhere classified: Secondary | ICD-10-CM

## 2016-09-12 DIAGNOSIS — Y92000 Kitchen of unspecified non-institutional (private) residence as  the place of occurrence of the external cause: Secondary | ICD-10-CM

## 2016-09-12 DIAGNOSIS — W19XXXA Unspecified fall, initial encounter: Secondary | ICD-10-CM | POA: Diagnosis present

## 2016-09-12 DIAGNOSIS — Z7982 Long term (current) use of aspirin: Secondary | ICD-10-CM

## 2016-09-12 DIAGNOSIS — R7989 Other specified abnormal findings of blood chemistry: Secondary | ICD-10-CM

## 2016-09-12 DIAGNOSIS — I6789 Other cerebrovascular disease: Secondary | ICD-10-CM | POA: Diagnosis not present

## 2016-09-12 DIAGNOSIS — E1169 Type 2 diabetes mellitus with other specified complication: Secondary | ICD-10-CM | POA: Diagnosis not present

## 2016-09-12 DIAGNOSIS — E1159 Type 2 diabetes mellitus with other circulatory complications: Secondary | ICD-10-CM | POA: Diagnosis present

## 2016-09-12 LAB — CBC WITH DIFFERENTIAL/PLATELET
BASOS ABS: 0 10*3/uL (ref 0.0–0.1)
BASOS PCT: 0 %
Eosinophils Absolute: 0 10*3/uL (ref 0.0–0.7)
Eosinophils Relative: 0 %
HEMATOCRIT: 41.7 % (ref 36.0–46.0)
HEMOGLOBIN: 14 g/dL (ref 12.0–15.0)
Lymphocytes Relative: 13 %
Lymphs Abs: 1 10*3/uL (ref 0.7–4.0)
MCH: 32 pg (ref 26.0–34.0)
MCHC: 33.6 g/dL (ref 30.0–36.0)
MCV: 95.2 fL (ref 78.0–100.0)
MONOS PCT: 8 %
Monocytes Absolute: 0.6 10*3/uL (ref 0.1–1.0)
NEUTROS ABS: 5.8 10*3/uL (ref 1.7–7.7)
NEUTROS PCT: 79 %
Platelets: 196 10*3/uL (ref 150–400)
RBC: 4.38 MIL/uL (ref 3.87–5.11)
RDW: 12.4 % (ref 11.5–15.5)
WBC: 7.4 10*3/uL (ref 4.0–10.5)

## 2016-09-12 LAB — BASIC METABOLIC PANEL
ANION GAP: 9 (ref 5–15)
BUN: 16 mg/dL (ref 6–20)
CHLORIDE: 103 mmol/L (ref 101–111)
CO2: 25 mmol/L (ref 22–32)
Calcium: 9.5 mg/dL (ref 8.9–10.3)
Creatinine, Ser: 1.17 mg/dL — ABNORMAL HIGH (ref 0.44–1.00)
GFR calc non Af Amer: 38 mL/min — ABNORMAL LOW (ref >60)
GFR, EST AFRICAN AMERICAN: 44 mL/min — AB (ref >60)
Glucose, Bld: 162 mg/dL — ABNORMAL HIGH (ref 65–99)
POTASSIUM: 3.8 mmol/L (ref 3.5–5.1)
Sodium: 137 mmol/L (ref 135–145)

## 2016-09-12 LAB — CBG MONITORING, ED: Glucose-Capillary: 146 mg/dL — ABNORMAL HIGH (ref 65–99)

## 2016-09-12 LAB — TROPONIN I: Troponin I: 0.1 ng/mL (ref <0.03)

## 2016-09-12 NOTE — ED Triage Notes (Signed)
Pt brought to ED by EMS from home for c/o weakness, pt states she is having frequent fall due to the weakness, denies any CP, SOB or LOC, pt denies hitting her head.

## 2016-09-12 NOTE — ED Provider Notes (Addendum)
[facility] Provider Note   CSN: [id number] Arrival date & time: 09/12/16  2217     History   Chief Complaint Chief Complaint  Patient presents with  . Fall  . Weakness    HPI Kristy Torres is a 83 y.o. female.  Pt comes with c/o falling today after her legs were bilaterally weak. Denies cp or sob. She states that she lives alone in a senior living apartment. She states that she has fallen before because her leg give out. She states that she thinks that she was on the floor for about an hour. She states that her legs were to weak to stand up. No loc with fall. Denies loc.       Past Medical History:  Diagnosis Date  . Diabetes mellitus   . High cholesterol   . Hypertension   . Hypokalemia     There are no active problems to display for this patient.   History reviewed. No pertinent surgical history.  OB History    No data available       Home Medications    Prior to Admission medications   Medication Sig Start Date End Date Taking? Authorizing Provider  aspirin 325 MG EC tablet Take 325 mg by mouth daily.    Historical Provider, MD  atenolol (TENORMIN) 50 MG tablet Take 1.5 tablets (75 mg total) by mouth daily. 12/05/15   Gerhard Munch, MD  atorvastatin (LIPITOR) 10 MG tablet Take 10 mg by mouth at bedtime.     Historical Provider, MD  glipiZIDE (GLUCOTROL XL) 2.5 MG 24 hr tablet Take 2.5 mg by mouth daily.    Historical Provider, MD  losartan-hydrochlorothiazide (HYZAAR) 100-25 MG per tablet Take 1 tablet by mouth daily.    Historical Provider, MD  potassium chloride SA (K-DUR,KLOR-CON) 20 MEQ tablet Take 40 mEq by mouth daily.     Historical Provider, MD    Family History History reviewed. No pertinent family history.  Social History Social History  Substance Use Topics  . Smoking status: Never Smoker  . Smokeless tobacco: Never Used  . Alcohol use No     Allergies   Patient has no known allergies.   Review of Systems Review of  Systems  All other systems reviewed and are negative.    Physical Exam Updated Vital Signs Pulse 72   Temp 98.5 F (36.9 C)   Resp 13   Ht 5' (1.524 m)   Wt 80.7 kg   SpO2 100%   BMI 34.76 kg/m   Physical Exam  Constitutional: She is oriented to person, place, and time. She appears well-developed.  HENT:  Head: Normocephalic and atraumatic.  Cardiovascular: Normal rate and regular rhythm.   Pulmonary/Chest: Effort normal and breath sounds normal.  Abdominal: Soft. Bowel sounds are normal. There is no tenderness.  Musculoskeletal: Normal range of motion.  No swelling noted to knees. Pt has full rom of hip and knee  Neurological: She is alert and oriented to person, place, and time.  Skin: Skin is warm and dry.  Psychiatric: She has a normal mood and affect.  Nursing note and vitals reviewed.    ED Treatments / Results  Labs (all labs ordered are listed, but only abnormal results are displayed) Labs Reviewed  TROPONIN I - Abnormal; Notable for the following:       Result Value   Troponin I 0.10 (*)    All other components within normal limits  BASIC METABOLIC PANEL - Abnormal; Notable for  the following:    Glucose, Bld 162 (*)    Creatinine, Ser 1.17 (*)    GFR calc non Af Amer 38 (*)    GFR calc Af Amer 44 (*)    All other components within normal limits  CBG MONITORING, ED - Abnormal; Notable for the following:    Glucose-Capillary 146 (*)    All other components within normal limits  CBC WITH DIFFERENTIAL/PLATELET  URINALYSIS, ROUTINE W REFLEX MICROSCOPIC    EKG  EKG Interpretation  Date/Time:  Wednesday September 12 2016 22:29:25 EST Ventricular Rate:  82 PR Interval:    QRS Duration: 128 QT Interval:  399 QTC Calculation: 466 R Axis:   11 Text Interpretation:  Right bundle branch block Confirmed by Kandis MannanMACKUEN, COURTNEY ([id number]) on 09/12/2016 10:38:53 PM       Radiology Dg Chest 2 View  Result Date: 09/12/2016 CLINICAL DATA:  Initial evaluation for  acute weakness. EXAM: CHEST  2 VIEW COMPARISON:  Prior radiograph from 12/05/2015. FINDINGS: Mild cardiomegaly, stable. Mediastinal silhouette within normal limits. Aortic atherosclerosis noted. Lungs are hypoinflated. Minimal subsegmental atelectasis noted at the left lung base. No focal infiltrates. No pulmonary edema or pleural effusion. The no pneumothorax. No acute osseus abnormality. IMPRESSION: 1. Shallow lung inflation with mild subsegmental atelectasis at the left lung base. No other active cardiopulmonary disease. 2. Mild cardiomegaly, stable. 3. Aortic atherosclerosis. Electronically Signed   By: Rise MuBenjamin  McClintock M.D.   On: 09/12/2016 23:10    Procedures Procedures (including critical care time)  Medications Ordered in ED Medications - No data to display   Initial Impression / Assessment and Plan / ED Course  I have reviewed the triage vital signs and the nursing notes.  Pertinent labs & imaging results that were available during my care of the patient were reviewed by me and considered in my medical decision making (see chart for details).  Clinical Course   with pt having episode of weakness and elevated troponin think pt needs to be admitted. Pt and family in agreement with plan pt states that she feels to weak to walk. Family was discussing pt the possibility of needing to go to a nursing home. Will talk with hospitalist to admit. Family states that she may have been on the floor for longer then 1 hour. Pt continues to feel week  Final Clinical Impressions(s) / ED Diagnoses   Final diagnoses:  None    New Prescriptions New Prescriptions   No medications on file     Teressa LowerVrinda Krishav Mamone, NP 09/13/16 0054    Teressa LowerVrinda Lurlie Wigen, NP 09/13/16 [id number]    Zadie Rhineonald Wickline, MD 09/13/16 0106    Teressa LowerVrinda Skyy Nilan, NP 09/13/16 0109    Courteney Randall AnLyn Mackuen, MD 09/13/16 1554

## 2016-09-12 NOTE — ED Notes (Signed)
Patient transported to X-ray 

## 2016-09-13 ENCOUNTER — Observation Stay (HOSPITAL_COMMUNITY): Payer: Medicare Other

## 2016-09-13 ENCOUNTER — Encounter (HOSPITAL_COMMUNITY): Payer: Self-pay | Admitting: Internal Medicine

## 2016-09-13 DIAGNOSIS — I10 Essential (primary) hypertension: Secondary | ICD-10-CM | POA: Diagnosis not present

## 2016-09-13 DIAGNOSIS — R7989 Other specified abnormal findings of blood chemistry: Secondary | ICD-10-CM

## 2016-09-13 DIAGNOSIS — E1159 Type 2 diabetes mellitus with other circulatory complications: Secondary | ICD-10-CM

## 2016-09-13 DIAGNOSIS — Z8673 Personal history of transient ischemic attack (TIA), and cerebral infarction without residual deficits: Secondary | ICD-10-CM | POA: Diagnosis not present

## 2016-09-13 DIAGNOSIS — W19XXXA Unspecified fall, initial encounter: Secondary | ICD-10-CM | POA: Diagnosis present

## 2016-09-13 DIAGNOSIS — R748 Abnormal levels of other serum enzymes: Secondary | ICD-10-CM | POA: Diagnosis present

## 2016-09-13 DIAGNOSIS — Y92000 Kitchen of unspecified non-institutional (private) residence as  the place of occurrence of the external cause: Secondary | ICD-10-CM | POA: Diagnosis not present

## 2016-09-13 DIAGNOSIS — S8991XA Unspecified injury of right lower leg, initial encounter: Secondary | ICD-10-CM | POA: Diagnosis not present

## 2016-09-13 DIAGNOSIS — E7800 Pure hypercholesterolemia, unspecified: Secondary | ICD-10-CM | POA: Diagnosis present

## 2016-09-13 DIAGNOSIS — S3993XA Unspecified injury of pelvis, initial encounter: Secondary | ICD-10-CM | POA: Diagnosis not present

## 2016-09-13 DIAGNOSIS — M25561 Pain in right knee: Secondary | ICD-10-CM | POA: Diagnosis not present

## 2016-09-13 DIAGNOSIS — Z7982 Long term (current) use of aspirin: Secondary | ICD-10-CM | POA: Diagnosis not present

## 2016-09-13 DIAGNOSIS — R778 Other specified abnormalities of plasma proteins: Secondary | ICD-10-CM | POA: Diagnosis present

## 2016-09-13 DIAGNOSIS — Z79899 Other long term (current) drug therapy: Secondary | ICD-10-CM | POA: Diagnosis not present

## 2016-09-13 DIAGNOSIS — R531 Weakness: Secondary | ICD-10-CM | POA: Diagnosis not present

## 2016-09-13 DIAGNOSIS — E1169 Type 2 diabetes mellitus with other specified complication: Secondary | ICD-10-CM | POA: Diagnosis present

## 2016-09-13 DIAGNOSIS — Z8249 Family history of ischemic heart disease and other diseases of the circulatory system: Secondary | ICD-10-CM | POA: Diagnosis not present

## 2016-09-13 DIAGNOSIS — M25559 Pain in unspecified hip: Secondary | ICD-10-CM | POA: Diagnosis not present

## 2016-09-13 LAB — CBC WITH DIFFERENTIAL/PLATELET
BASOS ABS: 0 10*3/uL (ref 0.0–0.1)
BASOS PCT: 0 %
Eosinophils Absolute: 0 10*3/uL (ref 0.0–0.7)
Eosinophils Relative: 1 %
HEMATOCRIT: 41.2 % (ref 36.0–46.0)
HEMOGLOBIN: 13.6 g/dL (ref 12.0–15.0)
LYMPHS PCT: 23 %
Lymphs Abs: 1.6 10*3/uL (ref 0.7–4.0)
MCH: 31.2 pg (ref 26.0–34.0)
MCHC: 33 g/dL (ref 30.0–36.0)
MCV: 94.5 fL (ref 78.0–100.0)
Monocytes Absolute: 0.8 10*3/uL (ref 0.1–1.0)
Monocytes Relative: 11 %
NEUTROS ABS: 4.6 10*3/uL (ref 1.7–7.7)
NEUTROS PCT: 65 %
Platelets: 177 10*3/uL (ref 150–400)
RBC: 4.36 MIL/uL (ref 3.87–5.11)
RDW: 12.6 % (ref 11.5–15.5)
WBC: 7.1 10*3/uL (ref 4.0–10.5)

## 2016-09-13 LAB — TROPONIN I
TROPONIN I: 0.1 ng/mL — AB (ref <0.03)
TROPONIN I: 0.06 ng/mL — AB (ref <0.03)
Troponin I: 0.13 ng/mL (ref <0.03)

## 2016-09-13 LAB — GLUCOSE, CAPILLARY
GLUCOSE-CAPILLARY: 132 mg/dL — AB (ref 65–99)
Glucose-Capillary: 100 mg/dL — ABNORMAL HIGH (ref 65–99)
Glucose-Capillary: 125 mg/dL — ABNORMAL HIGH (ref 65–99)
Glucose-Capillary: 156 mg/dL — ABNORMAL HIGH (ref 65–99)

## 2016-09-13 LAB — URINALYSIS, ROUTINE W REFLEX MICROSCOPIC
BILIRUBIN URINE: NEGATIVE
GLUCOSE, UA: NEGATIVE mg/dL
HGB URINE DIPSTICK: NEGATIVE
Ketones, ur: NEGATIVE mg/dL
Leukocytes, UA: NEGATIVE
Nitrite: NEGATIVE
PROTEIN: NEGATIVE mg/dL
SPECIFIC GRAVITY, URINE: 1.016 (ref 1.005–1.030)
pH: 6 (ref 5.0–8.0)

## 2016-09-13 LAB — CK
CK TOTAL: 503 U/L — AB (ref 38–234)
Total CK: 792 U/L — ABNORMAL HIGH (ref 38–234)

## 2016-09-13 LAB — COMPREHENSIVE METABOLIC PANEL
ALBUMIN: 3.2 g/dL — AB (ref 3.5–5.0)
ALK PHOS: 50 U/L (ref 38–126)
ALT: 16 U/L (ref 14–54)
ANION GAP: 8 (ref 5–15)
AST: 29 U/L (ref 15–41)
BILIRUBIN TOTAL: 0.4 mg/dL (ref 0.3–1.2)
BUN: 14 mg/dL (ref 6–20)
CALCIUM: 9.3 mg/dL (ref 8.9–10.3)
CO2: 28 mmol/L (ref 22–32)
Chloride: 104 mmol/L (ref 101–111)
Creatinine, Ser: 1.06 mg/dL — ABNORMAL HIGH (ref 0.44–1.00)
GFR calc non Af Amer: 43 mL/min — ABNORMAL LOW (ref >60)
GFR, EST AFRICAN AMERICAN: 50 mL/min — AB (ref >60)
GLUCOSE: 114 mg/dL — AB (ref 65–99)
POTASSIUM: 3.3 mmol/L — AB (ref 3.5–5.1)
SODIUM: 140 mmol/L (ref 135–145)
TOTAL PROTEIN: 6.3 g/dL — AB (ref 6.5–8.1)

## 2016-09-13 MED ORDER — ACETAMINOPHEN 325 MG PO TABS: 650.0000 mg | ORAL_TABLET | Freq: Four times a day (QID) | ORAL | Status: DC | PRN

## 2016-09-13 MED ORDER — HYDRALAZINE HCL 20 MG/ML IJ SOLN: 10.0000 mg | INTRAMUSCULAR | Status: DC | PRN

## 2016-09-13 MED ORDER — LOSARTAN POTASSIUM 50 MG PO TABS
100.0000 mg | ORAL_TABLET | Freq: Every day | ORAL | Status: DC
  Administered 2016-09-13 – 2016-09-14 (×2): 100 mg via ORAL
  Filled 2016-09-13 (×2): qty 2

## 2016-09-13 MED ORDER — ATORVASTATIN CALCIUM 10 MG PO TABS: 10.0000 mg | ORAL_TABLET | Freq: Every day | ORAL | Status: DC

## 2016-09-13 MED ORDER — SODIUM CHLORIDE 0.9 % IV SOLN
INTRAVENOUS | Status: AC
  Administered 2016-09-13: 04:00:00 via INTRAVENOUS

## 2016-09-13 MED ORDER — INSULIN ASPART 100 UNIT/ML ~~LOC~~ SOLN
0.0000 [IU] | Freq: Three times a day (TID) | SUBCUTANEOUS | Status: DC
  Administered 2016-09-13: 1 [IU] via SUBCUTANEOUS
  Administered 2016-09-13: 2 [IU] via SUBCUTANEOUS

## 2016-09-13 MED ORDER — AMLODIPINE BESYLATE 2.5 MG PO TABS
2.5000 mg | ORAL_TABLET | Freq: Every day | ORAL | Status: DC
  Administered 2016-09-13 – 2016-09-14 (×2): 2.5 mg via ORAL
  Filled 2016-09-13 (×2): qty 1

## 2016-09-13 MED ORDER — METOPROLOL SUCCINATE ER 100 MG PO TB24
200.0000 mg | ORAL_TABLET | Freq: Every day | ORAL | Status: DC
  Administered 2016-09-13 – 2016-09-14 (×2): 200 mg via ORAL
  Filled 2016-09-13 (×2): qty 2

## 2016-09-13 MED ORDER — ACETAMINOPHEN 650 MG RE SUPP: 650.0000 mg | Freq: Four times a day (QID) | RECTAL | Status: DC | PRN

## 2016-09-13 MED ORDER — ENOXAPARIN SODIUM 30 MG/0.3ML ~~LOC~~ SOLN
30.0000 mg | SUBCUTANEOUS | Status: DC
  Administered 2016-09-13 – 2016-09-14 (×2): 30 mg via SUBCUTANEOUS
  Filled 2016-09-13 (×2): qty 0.3

## 2016-09-13 MED ORDER — ASPIRIN EC 325 MG PO TBEC
325.0000 mg | DELAYED_RELEASE_TABLET | Freq: Every day | ORAL | Status: DC
  Administered 2016-09-13 – 2016-09-14 (×2): 325 mg via ORAL
  Filled 2016-09-13 (×2): qty 1

## 2016-09-13 NOTE — Progress Notes (Signed)
Pt seen and examined, admitted this am by Dr.Kakrakandy 89/F with HTN, DM, TIA,  admitted with generalized weakness/fall and inability to get up -exam -non focal -Diuretics on hold, s/p IVF, CT unremarkable couple of days ago, orthostatics negative -continue Metoprolol ( Take 200mg  daily-confirmed this with patient/checked bottle) and norvasc -monitor for bradycardia pauses -Mildly elevated troponin, no chest pain, non ACS pattern, check 2d ECHO -also with R knee and L pelvic pain after fall, Exam unremarkable but will check Xrays for completeness -Pt/OT eval -Home tomorrow with workup unremarkable  Zannie CovePreetha Jadien Lehigh, MD

## 2016-09-13 NOTE — ED Notes (Signed)
Pt unable to stand for orthostatic vitals 

## 2016-09-13 NOTE — Care Management Note (Signed)
Case Management Note  Patient Details  Name: Kristy Torres MRN: [id number] Date of Birth: 12-23-1918  Subjective/Objective:                 [place] Patient partially independent for ADLs at home, has assistance occasionally from family per granddaughter at bedside. Is not active with HH, and has never had it per pt. Patient has WC RW cane at home. Family provides transportation to apts etc.    Action/Plan:  CM will continue to follow for DC planning.  Expected Discharge Date:  09/16/16               Expected Discharge Plan:  Home w Home Health Services  In-House Referral:     Discharge planning Services  CM Consult  Post Acute Care Choice:    Choice offered to:     DME Arranged:    DME Agency:     HH Arranged:    HH Agency:     Status of Service:  In process, will continue to follow  If discussed at Long Length of Stay Meetings, dates discussed:    Additional Comments:  Kristy SabalDebbie Parthenia Tellefsen, RN 09/13/2016, 2:48 PM

## 2016-09-13 NOTE — H&P (Signed)
History and Physical    Kristy Torres ZOX:[id number] DOB: 09-02-1919 DOA: 09/12/2016  PCP: Lonie Peak, PA-C  Patient coming from: Home.  Chief Complaint: Fall.  HPI: Kristy Torres is a 83 y.o. female with history of hypertension, TIA, diabetes mellitus type 2 was brought to the ER after patient had a fall at her apartment. Patient lives in a senior citizen apartment community. Patient states she has been feeling weak over the last 1 week. Denies any fever chills headaches focal symptoms. Patient had come to the ER 3 days ago and had CT head which was unremarkable. Last evening patient's daughter was trying to get in contact with the patient and and was unable to do so they called 911. Emergency personnel's found patient on the floor. Patient states she was in the kitchen around 5 PM and her legs gave away and fell. Patient might have been lying on the floor for almost 3 hours. Denies hitting her head or losing consciousness. Due to weakness patient was unable to stand up. Denies any incontinence of urine and bowel or tongue bite. In the ER patient blood pressures found to be elevated. Troponin and CK are mildly elevated. Chest x-ray EKG shows nonspecific finding. Patient will be admitted for further observation and management for weakness. On exam patient is able to move all extremities. Has mild pain on the right knee.   ED Course: Chest x-ray EKG were showing nonspecific findings. Troponin mildly elevated.  Review of Systems: As per HPI, rest all negative.   Past Medical History:  Diagnosis Date  . Diabetes mellitus   . High cholesterol   . Hypertension   . Hypokalemia     History reviewed. No pertinent surgical history.   reports that she has never smoked. She has never used smokeless tobacco. She reports that she does not drink alcohol or use drugs.  No Known Allergies  Family History  Problem Relation Age of Onset  . Hypertension Other     Prior to Admission  medications   Medication Sig Start Date End Date Taking? Authorizing Provider  AMLODIPINE BESYLATE PO Take 2.5 mg by mouth daily.   Yes Historical Provider, MD  aspirin 325 MG EC tablet Take 325 mg by mouth daily.   Yes Historical Provider, MD  atorvastatin (LIPITOR) 10 MG tablet Take 10 mg by mouth at bedtime.    Yes Historical Provider, MD  losartan-hydrochlorothiazide (HYZAAR) 100-25 MG per tablet Take 1 tablet by mouth daily.   Yes Historical Provider, MD  metoprolol (TOPROL XL) 200 MG 24 hr tablet Take 200 mg by mouth daily.   Yes Historical Provider, MD  potassium chloride SA (K-DUR,KLOR-CON) 20 MEQ tablet Take 40 mEq by mouth daily.    Yes Historical Provider, MD  atenolol (TENORMIN) 50 MG tablet Take 1.5 tablets (75 mg total) by mouth daily. 12/05/15   Gerhard Munch, MD  glipiZIDE (GLUCOTROL XL) 2.5 MG 24 hr tablet Take 2.5 mg by mouth daily.    Historical Provider, MD    Physical Exam: Vitals:   09/13/16 0015 09/13/16 0115 09/13/16 0130 09/13/16 0222  BP: 144/96 142/59 148/56 (!) 179/90  Pulse: 63 62 (!) 37 84  Resp: 23 20 21 18   Temp:    98.6 F (37 C)  TempSrc:    Oral  SpO2: 100% 99% 98% 100%  Weight:      Height:          Constitutional: Moderately built and nourished. Vitals:   09/13/16 0015  09/13/16 0115 09/13/16 0130 09/13/16 0222  BP: 144/96 142/59 148/56 (!) 179/90  Pulse: 63 62 (!) 37 84  Resp: 23 20 21 18   Temp:    98.6 F (37 C)  TempSrc:    Oral  SpO2: 100% 99% 98% 100%  Weight:      Height:       Eyes: Anicteric. No pallor. ENMT: No discharge from the ears eyes nose and mouth. Neck: No mass felt. No neck rigidity. Respiratory: No rhonchi or crepitations. Cardiovascular: S1-S2 heard no murmurs appreciated. Abdomen: Soft nontender bowel sounds present. No guarding or rigidity. Musculoskeletal: No edema. No joint effusion. Skin: No rash. Skin appears warm. Neurologic: Alert awake oriented to time place and person. Moves all extremities 5 x  5. Psychiatric: Appears normal. Normal affect.   Labs on Admission: I have personally reviewed following labs and imaging studies  CBC:  Recent Labs Lab 09/08/16 1549 09/08/16 1605 09/12/16 2233  WBC 5.4  --  7.4  NEUTROABS 3.3  --  5.8  HGB 13.7 13.9 14.0  HCT 41.3 41.0 41.7  MCV 95.8  --  95.2  PLT 189  --  196   Basic Metabolic Panel:  Recent Labs Lab 09/08/16 1549 09/08/16 1605 09/12/16 2233  NA 138 140 137  K 3.6 3.6 3.8  CL 103 99* 103  CO2 28  --  25  GLUCOSE 154* 149* 162*  BUN 12 14 16   CREATININE 1.04* 1.00 1.17*  CALCIUM 9.5  --  9.5   GFR: Estimated Creatinine Clearance: 27.1 mL/min (by C-G formula based on SCr of 1.17 mg/dL (H)). Liver Function Tests:  Recent Labs Lab 09/08/16 1549  AST 18  ALT 16  ALKPHOS 49  BILITOT 0.6  PROT 6.6  ALBUMIN 3.3*   No results for input(s): LIPASE, AMYLASE in the last 168 hours. No results for input(s): AMMONIA in the last 168 hours. Coagulation Profile:  Recent Labs Lab 09/08/16 1549  INR 0.95   Cardiac Enzymes:  Recent Labs Lab 09/12/16 2233  CKTOTAL 503*  TROPONINI 0.10*   BNP (last 3 results) No results for input(s): PROBNP in the last 8760 hours. HbA1C: No results for input(s): HGBA1C in the last 72 hours. CBG:  Recent Labs Lab 09/08/16 1549 09/12/16 2233  GLUCAP 161* 146*   Lipid Profile: No results for input(s): CHOL, HDL, LDLCALC, TRIG, CHOLHDL, LDLDIRECT in the last 72 hours. Thyroid Function Tests: No results for input(s): TSH, T4TOTAL, FREET4, T3FREE, THYROIDAB in the last 72 hours. Anemia Panel: No results for input(s): VITAMINB12, FOLATE, FERRITIN, TIBC, IRON, RETICCTPCT in the last 72 hours. Urine analysis:    Component Value Date/Time   COLORURINE YELLOW 09/12/2016 0016   APPEARANCEUR HAZY (A) 09/12/2016 0016   LABSPEC 1.016 09/12/2016 0016   PHURINE 6.0 09/12/2016 0016   GLUCOSEU NEGATIVE 09/12/2016 0016   HGBUR NEGATIVE 09/12/2016 0016   BILIRUBINUR NEGATIVE  09/12/2016 0016   KETONESUR NEGATIVE 09/12/2016 0016   PROTEINUR NEGATIVE 09/12/2016 0016   UROBILINOGEN 0.2 02/24/2015 0243   NITRITE NEGATIVE 09/12/2016 0016   LEUKOCYTESUR NEGATIVE 09/12/2016 0016   Sepsis Labs: @LABRCNTIP (procalcitonin:4,lacticidven:4) )No results found for this or any previous visit (from the past 240 hour(s)).   Radiological Exams on Admission: Dg Chest 2 View  Result Date: 09/12/2016 CLINICAL DATA:  Initial evaluation for acute weakness. EXAM: CHEST  2 VIEW COMPARISON:  Prior radiograph from 12/05/2015. FINDINGS: Mild cardiomegaly, stable. Mediastinal silhouette within normal limits. Aortic atherosclerosis noted. Lungs are hypoinflated. Minimal subsegmental atelectasis noted  at the left lung base. No focal infiltrates. No pulmonary edema or pleural effusion. The no pneumothorax. No acute osseus abnormality. IMPRESSION: 1. Shallow lung inflation with mild subsegmental atelectasis at the left lung base. No other active cardiopulmonary disease. 2. Mild cardiomegaly, stable. 3. Aortic atherosclerosis. Electronically Signed   By: Rise Mu M.D.   On: 09/12/2016 23:10    EKG: Independently reviewed. Normal sinus rhythm with PVCs. RBBB.  Assessment/Plan Active Problems:   Generalized weakness   Fall   Elevated troponin   Essential hypertension   Type 2 diabetes mellitus with vascular disease (HCC)    1. Generalized weakness - patient appears nonfocal. CT head 2 days ago was unremarkable. Will closely monitor in telemetry and gently hydrate get physical therapy consult hold diuretics. 2. Elevated troponin and CK levels - we'll cycle cardiac markers gently hydrate keep patient on aspirin. Patient at this time denies any chest pain. Hold statins due to elevated CK levels. Check 2-D echo. 3. Hypertension uncontrolled - continue metoprolol and Norvasc. I have placed patient on when necessary IV hydralazine for systolic blood pressure more than 180. Closely follow  blood pressure trend. 4. Diabetes mellitus type 2 - presently on no medications for last 3 years. Closely follow CBGs with sliding scale coverage. 5. History of TIA - on aspirin. Hold statins due to elevated CK levels.  X-ray of the right knee and pelvis are pending.   DVT prophylaxis: Lovenox. Code Status: Full code.  Family Communication: [name].  Disposition Plan: To be determined.  Consults called: Physical therapy.  Admission status: Observation.    Eduard Clos MD [facility] Pager [phone].  If 7PM-7AM, please contact night-coverage [email or web address] Password [email or web address]  09/13/2016, 3:20 AM

## 2016-09-13 NOTE — Care Management Obs Status (Signed)
MEDICARE OBSERVATION STATUS NOTIFICATION   Patient Details  Name: Kristy Torres MRN: [id number] Date of Birth: June 15, 1919   Medicare Observation Status Notification Given:  Yes    Lawerance Sabalebbie Nasteho Glantz, RN 09/13/2016, 2:48 PM

## 2016-09-13 NOTE — ED Notes (Signed)
Called lab to add on CK 

## 2016-09-13 NOTE — Progress Notes (Signed)
PT Cancellation Note  Patient Details Name: Kristy Torres MRN: [id number] DOB: [birth year]   Cancelled Treatment:    Reason Eval/Treat Not Completed: Medical issues which prohibited therapy. Pt with elevated troponin, trending up. Will attempt PT eval again as medically appropriate   DONAWERTH,KAREN 09/13/2016, 8:11 AM

## 2016-09-14 ENCOUNTER — Inpatient Hospital Stay (HOSPITAL_COMMUNITY): Payer: Medicare Other

## 2016-09-14 LAB — GLUCOSE, CAPILLARY
GLUCOSE-CAPILLARY: 83 mg/dL (ref 65–99)
Glucose-Capillary: 114 mg/dL — ABNORMAL HIGH (ref 65–99)

## 2016-09-14 NOTE — Progress Notes (Signed)
[facility] Anticipated DC date: 09/14/16 Family notified: Daughters Transport by: Set [employer]Car   Per MD [facility]. RN, patient, patient's family, and facility notified of DC. Discharge Summary sent to facility. RN given number for report [phone]) [phone]. DC packet on chart-TB Skin Test order and Home health PT orders.  CSW signing off.  Cristobal GoldmannNadia Euell Schiff, [place] Clinical Social Worker [phone]

## 2016-09-14 NOTE — Clinical Social Work Placement (Signed)
   CLINICAL SOCIAL WORK PLACEMENT  NOTE  Date:  09/14/2016  Patient Details  Name: Darrall DearsBalfour E Keizer MRN: [id number] Date of Birth: 02/18/1919  Clinical Social Work is seeking post-discharge placement for this patient at the Skilled  Nursing Facility level of care (*CSW will initial, date and re-position this form in  chart as items are completed):  Yes   Patient/family provided with [place] Clinical Social Work Department's list of facilities offering this level of care within the geographic area requested by the patient (or if unable, by the patient's family).  Yes   Patient/family informed of their freedom to choose among providers that offer the needed level of care, that participate in Medicare, Medicaid or managed care program needed by the patient, have an available bed and are willing to accept the patient.  Yes   Patient/family informed of [facility]'s ownership interest in [facility] and [facility], as well as of the fact that they are under no obligation to receive care at these facilities.  PASRR submitted to EDS on 09/14/16     PASRR number received on 09/14/16     Existing PASRR number confirmed on       FL2 transmitted to all facilities in geographic area requested by pt/family on 09/14/16     FL2 transmitted to all facilities within larger geographic area on       Patient informed that his/her managed care company has contracts with or will negotiate with certain facilities, including the following:        Yes   Patient/family informed of bed offers received.  Patient chooses bed at  [facility]     Physician recommends and patient chooses bed at      Patient to be transferred to   on 09/14/16.  Patient to be transferred to facility by Car     Patient family notified on 09/14/16 of transfer.  Name of family member notified:  Daughters in room     PHYSICIAN       Additional Comment:     _______________________________________________ Mearl LatinNadia S Permelia Bamba, LCSWA 09/14/2016, 4:16 PM

## 2016-09-14 NOTE — Care Management Note (Signed)
Case Management Note  Patient Details  Name: Kristy Torres MRN: [id number] Date of Birth: 02-14-1919  Subjective/Objective:    Generalized weakness, fall                Action/Plan: Discharge Planning: AVS reviewed:  Chart reviewed. CSW following for return back to AL[facility], [facility]. HH orders and Facesheet sent with dc package. ALF will provide RN, PT and aide. Pt will travel back to ALF via private car.    Expected Discharge Date:  09/16/16               Expected Discharge Plan:  Assisted Living / Rest Home  In-House Referral:  Clinical Social Work  Discharge planning Services  CM Consult  Post Acute Care Choice:  NA Choice offered to:  NA  DME Arranged:  N/A DME Agency:  NA  HH Arranged:  NA HH Agency:  NA  Status of Service:  Completed, signed off  If discussed at [employer] Length of Stay Meetings, dates discussed:    Additional Comments:  Elliot CousinShavis, [place] Nav[place] Ellen, RN 09/14/2016, 4:39 PM

## 2016-09-14 NOTE — NC FL2 (Signed)
[place] MEDICAID FL2 LEVEL OF CARE SCREENING TOOL     IDENTIFICATION  Patient Name: Kristy Torres Birthdate: [birth year] Sex: female Admission Date (Current Location): 09/12/2016  [facility] and [place] Number:  Producer, television/film/video and Address:  [facility], [address] [place], [place] [address]      Provider Number: [id number]  Attending Physician Name and Address:  Rhetta Mura, MD  Relative Name and Phone Number:  Kendal Hymen daughter, [phone]    Current Level of Care: Hospital Recommended Level of Care: Skilled Nursing Facility Prior Approval Number:    Date Approved/Denied:   PASRR Number: [id number]  Discharge Plan: SNF    Current Diagnoses: Patient Active Problem List   Diagnosis Date Noted  . Generalized weakness 09/13/2016  . Fall 09/13/2016  . Elevated troponin 09/13/2016  . Essential hypertension 09/13/2016  . Type 2 diabetes mellitus with vascular disease (HCC) 09/13/2016    Orientation RESPIRATION BLADDER Height & Weight     Self, Time, Situation, Place  Normal Continent Weight: 78.5 kg (173 lb 1 oz) Height:  5' (152.4 cm)  BEHAVIORAL SYMPTOMS/MOOD NEUROLOGICAL BOWEL NUTRITION STATUS      Incontinent Diet (Please see DC Summary)  AMBULATORY STATUS COMMUNICATION OF NEEDS Skin   Limited Assist Verbally Normal                       Personal Care Assistance Level of Assistance  Bathing, Feeding, Dressing Bathing Assistance: Limited assistance Feeding assistance: Independent Dressing Assistance: Limited assistance     Functional Limitations Info  Sight, Hearing Sight Info: Impaired Hearing Info: Impaired      SPECIAL CARE FACTORS FREQUENCY  PT (By licensed PT)     PT Frequency: 5x/week              Contractures      Additional Factors Info  Code Status, Allergies, Insulin Sliding Scale Code Status Info: Full Allergies Info: NKA   Insulin Sliding Scale Info: 3 times daily with meals        Current Medications (09/14/2016):  This is the current hospital active medication list Current Facility-Administered Medications  Medication Dose Route Frequency Provider Last Rate Last Dose  . acetaminophen (TYLENOL) tablet 650 mg  650 mg Oral Q6H PRN Eduard Clos, MD       Or  . acetaminophen (TYLENOL) suppository 650 mg  650 mg Rectal Q6H PRN Eduard Clos, MD      . amLODipine (NORVASC) tablet 2.5 mg  2.5 mg Oral Daily Eduard Clos, MD   2.5 mg at 09/13/16 0849  . aspirin EC tablet 325 mg  325 mg Oral Daily Eduard Clos, MD   325 mg at 09/13/16 0848  . enoxaparin (LOVENOX) injection 30 mg  30 mg Subcutaneous Q24H Eduard Clos, MD   30 mg at 09/13/16 0851  . hydrALAZINE (APRESOLINE) injection 10 mg  10 mg Intravenous Q4H PRN Eduard Clos, MD      . insulin aspart (novoLOG) injection 0-9 Units  0-9 Units Subcutaneous TID WC Eduard Clos, MD   2 Units at 09/13/16 1806  . losartan (COZAAR) tablet 100 mg  100 mg Oral Daily Eduard Clos, MD   100 mg at 09/13/16 0849  . metoprolol succinate (TOPROL-XL) 24 hr tablet 200 mg  200 mg Oral Daily Eduard Clos, MD   200 mg at 09/13/16 8469     Discharge Medications: Please see discharge summary  for a list of discharge medications.  Relevant Imaging Results:  Relevant Lab Results:   Additional Information SSN: [id number]  Willem Klingensmith S ClaytonRayyan, [place]WA

## 2016-09-14 NOTE — Evaluation (Signed)
Physical Therapy Evaluation Patient Details Name: Kristy Torres MRN: [id number] DOB: 1919-08-04 Today's Date: 09/14/2016   History of Present Illness   Kristy Torres is a 83 y.o. female with history of hypertension, TIA, diabetes mellitus type 2 was brought to the ER after patient had a fall at her apartment. Patient lives in a senior citizen apartment community. Patient states she has been feeling weak over the last 1 week.  After fall pt was on ground at least 3 hours before being found by family.  Clinical Impression  Pt admitted with above diagnosis. Pt currently with functional limitations due to the deficits listed below (see PT Problem List).  Pt will benefit from skilled PT to increase their independence and safety with mobility to allow discharge to the venue listed below.  Pt lives alone and normally ambulates with cane.  She would benefit from short term SNF rehab to get stronger and family interested in ALF once she is stronger.     Follow Up Recommendations SNF;Supervision/Assistance - 24 hour    Equipment Recommendations  None recommended by PT    Recommendations for Other Services       Precautions / Restrictions Precautions Precautions: Fall Restrictions Weight Bearing Restrictions: No      Mobility  Bed Mobility Overal bed mobility: Needs Assistance Bed Mobility: Supine to Sit     Supine to sit: Mod assist;HOB elevated     General bed mobility comments: MOD A to get trunk up and hips to EOB  Transfers Overall transfer level: Needs assistance Equipment used: Rolling walker (2 wheeled) Transfers: Sit to/from Stand Sit to Stand: Min assist         General transfer comment: MIN A to power up and cueing for hand placement  Ambulation/Gait Ambulation/Gait assistance: Min assist Ambulation Distance (Feet): 10 Feet Assistive device: Rolling walker (2 wheeled) Gait Pattern/deviations: Decreased step length - right;Decreased step length - left;Trunk  flexed;Shuffle Gait velocity: decreased   General Gait Details: slow gait with shuffeled gait pattern.  cues to keep RW on ground.  Stairs            Wheelchair Mobility    Modified Rankin (Stroke Patients Only)       Balance Overall balance assessment: History of Falls                                           Pertinent Vitals/Pain Pain Assessment: No/denies pain    Home Living Family/patient expects to be discharged to:: Skilled nursing facility                      Prior Function Level of Independence: Independent with assistive device(s)         Comments: AMb with cane, but with recent fall.  Daughter reports recent decline in ambulation distance and quality of gait     Hand Dominance        Extremity/Trunk Assessment   Upper Extremity Assessment Upper Extremity Assessment: Generalized weakness;Overall WFL for tasks assessed    Lower Extremity Assessment Lower Extremity Assessment: Overall WFL for tasks assessed;Generalized weakness    Cervical / Trunk Assessment Cervical / Trunk Assessment: Normal  Communication   Communication: HOH  Cognition Arousal/Alertness: Awake/alert Behavior During Therapy: WFL for tasks assessed/performed Overall Cognitive Status: Within Functional Limits for tasks assessed  General Comments      Exercises     Assessment/Plan    PT Assessment Patient needs continued PT services  PT Problem List Decreased strength;Decreased activity tolerance;Decreased balance;Decreased mobility;Decreased knowledge of use of DME          PT Treatment Interventions DME instruction;Gait training;Functional mobility training;Therapeutic activities;Therapeutic exercise;Balance training    PT Goals (Current goals can be found in the Care Plan section)  Acute Rehab PT Goals Patient Stated Goal: Rehab then if strong enough an ALF PT Goal Formulation: With family Time For Goal  Achievement: 09/21/16 Potential to Achieve Goals: Good    Frequency Min 3X/week   Barriers to discharge Decreased caregiver support lives alone    Co-evaluation               End of Session Equipment Utilized During Treatment: Gait belt Activity Tolerance: Patient tolerated treatment well Patient left: in chair;with call bell/phone within reach;with chair alarm set;with family/visitor present Nurse Communication: Mobility status         Time: [id number] PT Time Calculation (min) (ACUTE ONLY): 27 min   Charges:   PT Evaluation $PT Eval Moderate Complexity: 1 Procedure PT Treatments $Gait Training: 8-22 mins   PT G Codes:        Kristy Torres 09/14/2016, 8:39 AM

## 2016-09-14 NOTE — Clinical Social Work Note (Signed)
Clinical Social Work Assessment  Patient Details  Name: Kristy Torres MRN: [id number] Date of Birth: May 22, 1919  Date of referral:  09/14/16               Reason for consult:  Facility Placement                Permission sought to share information with:  Facility Sport and exercise psychologist, Family Supports Permission granted to share information::  Yes, Verbal Permission Granted  Name::     Personal assistant::  SNFs  Relationship::  Daughter  Contact Information:  [phone]  Housing/Transportation Living arrangements for the past 2 months:  Apartment Source of Information:  Patient, Adult Children Patient Interpreter Needed:  None Criminal Activity/Legal Involvement Pertinent to Current Situation/Hospitalization:  No - Comment as needed Significant Relationships:  Adult Children, Other Family Members Lives with:  Self Do you feel safe going back to the place where you live?  No Need for family participation in patient care:  Yes (Comment)  Care giving concerns:  CSW received consult for possible SNF placement at time of discharge. CSW met with patient and patient's daughter at bedside regarding PT recommendation of SNF placement at time of discharge. Per patient's daughter, patient lives alone and is currently unable to care for herself at home given patient's current physical needs and fall risk. Patient and patient's daughter expressed understanding of PT recommendation and are agreeable to SNF placement at time of discharge. CSW to continue to follow and assist with discharge planning needs.   Social Worker assessment / plan:  CSW spoke with patient and patient's daughter concerning possibility of rehab at [facility] before returning home.  Employment status:  Retired Forensic scientist:  Information systems manager, Medicaid In [place] PT Recommendations:  [place] / Referral to community resources:  [place]  Patient/Family's Response to care:  Patient and  patient's daughter recognize need for rehab before returning home and are agreeable to a SNF in [place]. CSW explained that patient does not meet Medicare 3 inpatient night requirement and will have to discharge to a facility that will accept her under Medicaid.  Patient/Family's Understanding of and Emotional Response to Diagnosis, Current Treatment, and Prognosis:  Patient/family is realistic regarding therapy needs and expressed being hopeful for SNF placement. Patient expressed understanding of CSW role and discharge process. No questions/concerns about plan or treatment.    Emotional Assessment Appearance:  Appears stated age Attitude/Demeanor/Rapport:  Other (Appropriate) Affect (typically observed):  Accepting, Adaptable, Appropriate, Pleasant Orientation:  Oriented to Self, Oriented to Situation, Oriented to Place, Oriented to  Time Alcohol / Substance use:  Not Applicable Psych involvement (Current and /or in the community):  No (Comment)  Discharge Needs  Concerns to be addressed:  Care Coordination Readmission within the last 30 days:  Yes Current discharge risk:  None Barriers to Discharge:  No Barriers Identified   Benard Halsted, [place] 09/14/2016, 2:27 PM

## 2016-09-14 NOTE — Discharge Summary (Signed)
Physician Discharge Summary  Kristy DearsBalfour E Torres RUE:[id number] DOB: 1919-08-17 DOA: 09/12/2016  PCP: Lonie PeakNathan Conroy, PA-C  Admit date: 09/12/2016 Discharge date: 09/14/2016  Time spent: 40 minutes  Recommendations for Outpatient Follow-up:  1. Held the patient's amlodipine and valsartan HCTZ combination medication as suspect orthostasis 2. 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 Patient requires basic metabolic and complete blood count 1 week 4. Patient should ambulate with a walker  Discharge Diagnoses:  Active Problems:   Generalized weakness   Fall   Elevated troponin   Essential hypertension   Type 2 diabetes mellitus with vascular disease [facility](HCC)   Discharge Condition: good  Diet recommendation: hh low salt  Filed Weights   09/12/16 2226 09/14/16 0434  Weight: 80.7 kg (178 lb) 78.5 kg (173 lb 1 oz)    History of present illness:  Final diagnosis-idiopathic fall  83 year old with history of HTN TIA Well-controlled DM TY2 Feeling weak over 1 week-transferred to the emergency room and was found to be on the ground subsequent to returning back to her facility and was unable to stand  Patient was observed overnight and had no bradycardic or other pauses-some pvc + pac only-hr 60-70 CK and troponin were mildly elevated and this might in because of fall Cardiogram was ordered but canceled given clinical stability and no recurrence in addition to no further chest pain will need skilled basement on discharge    Discharge Exam: Vitals:   09/14/16 0426 09/14/16 1406  BP: (!) 162/83 (!) 136/41  Pulse: 72 (!) 59  Resp: 16 18  Temp: 98.8 F (37.1 C) 99.3 F (37.4 C)    General: Pleasant oriented no apparent distress Cardiovascular: S1-S2 no murmur rub or gallop Respiratory: Clear no added sound No focal deficit Abdomen soft nontender  Discharge Instructions   Discharge Instructions    Diet - low  sodium heart healthy    Complete by:  As directed    Increase activity slowly    Complete by:  As directed      Current Discharge Medication List    CONTINUE these medications which have NOT CHANGED   Details  aspirin 325 MG EC tablet Take 325 mg by mouth daily.    atorvastatin (LIPITOR) 10 MG tablet Take 10 mg by mouth at bedtime.     metoprolol (TOPROL XL) 200 MG 24 hr tablet Take 200 mg by mouth daily.    potassium chloride SA (K-DUR,KLOR-CON) 20 MEQ tablet Take 40 mEq by mouth daily.     glipiZIDE (GLUCOTROL XL) 2.5 MG 24 hr tablet Take 2.5 mg by mouth daily.      STOP taking these medications     AMLODIPINE BESYLATE PO      losartan-hydrochlorothiazide (HYZAAR) 100-25 MG per tablet      atenolol (TENORMIN) 50 MG tablet        No Known Allergies    The results of significant diagnostics from this hospitalization (including imaging, microbiology, ancillary and laboratory) are listed below for reference.    Significant Diagnostic Studies: Dg Chest 2 View  Result Date: 09/12/2016 CLINICAL DATA:  Initial evaluation for acute weakness. EXAM: CHEST  2 VIEW COMPARISON:  Prior radiograph from 12/05/2015. FINDINGS: Mild cardiomegaly, stable. Mediastinal silhouette within normal limits. Aortic atherosclerosis noted. Lungs are hypoinflated. Minimal subsegmental atelectasis noted at the left lung base. No focal infiltrates. No pulmonary edema or pleural effusion. The no pneumothorax. No acute osseus abnormality. IMPRESSION: 1.  Shallow lung inflation with mild subsegmental atelectasis at the left lung base. No other active cardiopulmonary disease. 2. Mild cardiomegaly, stable. 3. Aortic atherosclerosis. Electronically Signed   By: Rise Mu M.D.   On: 09/12/2016 23:10   Dg Pelvis 1-2 Views  Result Date: 09/13/2016 CLINICAL DATA:  Tenderness in the pelvis and right knee status post fall EXAM: PELVIS - 1-2 VIEW COMPARISON:  None. FINDINGS: No acute fracture or dislocation.  Mild osteoarthritis of the left hip. Degenerative changes of the pubic symphysis. No lytic or sclerotic osseous lesion. IMPRESSION: Mild osteoarthritis of the left hip. Electronically Signed   By: Elige Ko   On: 09/13/2016 13:12   Dg Knee 1-2 Views Right  Result Date: 09/13/2016 CLINICAL DATA:  83 year old female with an anterior right knee pain after a fall today. Initial encounter. EXAM: RIGHT KNEE - 1-2 VIEW COMPARISON:  None. FINDINGS: Severe medial compartment joint space loss and degenerative spurring. More mild lateral and patellofemoral compartment degeneration. The patella appears intact. No definite joint effusion on the cross-table lateral view. No acute osseous abnormality identified. IMPRESSION: 1. No acute fracture or dislocation identified about the right knee. 2. Moderate to severe medial compartment joint degeneration. Electronically Signed   By: Odessa Fleming M.D.   On: 09/13/2016 13:15   Ct Head Wo Contrast  Result Date: 09/08/2016 CLINICAL DATA:  Dizziness. EXAM: CT HEAD WITHOUT CONTRAST TECHNIQUE: Contiguous axial images were obtained from the base of the skull through the vertex without intravenous contrast. COMPARISON:  CT scan of December 05, 2015. FINDINGS: Brain: Mild chronic ischemic white matter disease is noted. Left occipital encephalomalacia is noted consistent with old infarction. Minimal diffuse cortical atrophy is noted. No mass effect or midline shift is noted. Ventricular size is within normal limits. There is no evidence of mass lesion, hemorrhage or acute infarction. Vascular: Atherosclerosis of carotid siphons is noted. Skull: Normal. Negative for fracture or focal lesion. Sinuses/Orbits: No acute finding. Other: None. IMPRESSION: Mild chronic ischemic white matter disease. Old left occipital infarction. Minimal diffuse cortical atrophy. No acute intracranial abnormality seen. Electronically Signed   By: Lupita Raider, M.D.   On: 09/08/2016 16:38    Microbiology: No  results found for this or any previous visit (from the past 240 hour(s)).   Labs: Basic Metabolic Panel:  Recent Labs Lab 09/08/16 1549 09/08/16 1605 09/12/16 2233 09/13/16 0341  NA 138 140 137 140  K 3.6 3.6 3.8 3.3*  CL 103 99* 103 104  CO2 28  --  25 28  GLUCOSE 154* 149* 162* 114*  BUN 12 14 16 14   CREATININE 1.04* 1.00 1.17* 1.06*  CALCIUM 9.5  --  9.5 9.3   Liver Function Tests:  Recent Labs Lab 09/08/16 1549 09/13/16 0341  AST 18 29  ALT 16 16  ALKPHOS 49 50  BILITOT 0.6 0.4  PROT 6.6 6.3*  ALBUMIN 3.3* 3.2*   No results for input(s): LIPASE, AMYLASE in the last 168 hours. No results for input(s): AMMONIA in the last 168 hours. CBC:  Recent Labs Lab 09/08/16 1549 09/08/16 1605 09/12/16 2233 09/13/16 0341  WBC 5.4  --  7.4 7.1  NEUTROABS 3.3  --  5.8 4.6  HGB 13.7 13.9 14.0 13.6  HCT 41.3 41.0 41.7 41.2  MCV 95.8  --  95.2 94.5  PLT 189  --  196 177   Cardiac Enzymes:  Recent Labs Lab 09/12/16 2233 09/13/16 0341 09/13/16 0929 09/13/16 1459  CKTOTAL 503* 792*  --   --  TROPONINI 0.10* 0.13* 0.10* 0.06*   BNP: BNP (last 3 results) No results for input(s): BNP in the last 8760 hours.  ProBNP (last 3 results) No results for input(s): PROBNP in the last 8760 hours.  CBG:  Recent Labs Lab 09/13/16 1212 09/13/16 1701 09/13/16 2153 09/14/16 0813 09/14/16 1225  GLUCAP 125* 156* 132* 83 114*       Signed:  Rhetta Mura MD   [facility] 09/14/2016, 2:50 PM

## 2016-09-14 NOTE — [place]2 (Signed)
[place] MEDICAID FL2 LEVEL OF CARE SCREENING TOOL     IDENTIFICATION  Patient Name: Kristy DearsBalfour E Pavia Birthdate: [birth year] Sex: female Admission Date (Current Location): 09/12/2016  [facility] [place]   Facility and Address:  [facility], [address], [place], [place] [address]      Provider Number: [id number]  Attending Physician Name and Address:  Rhetta MuraJai-Gurmukh Samtani, MD  Relative Name and Phone Number:  Kendal HymenBonnie, daughter, [phone]    Current Level of Care: Hospital Recommended Level of Care: Assisted Living Facility Prior Approval Number:    Date Approved/Denied:   PASRR Number:   Discharge Plan: Other (Comment) (ALF)    Current Diagnoses: Patient Active Problem List   Diagnosis Date Noted  . Generalized weakness 09/13/2016  . Fall 09/13/2016  . Elevated troponin 09/13/2016  . Essential hypertension 09/13/2016  . Type 2 diabetes mellitus with vascular disease (HCC) 09/13/2016    Orientation RESPIRATION BLADDER Height & Weight     Self, Time, Situation, Place  Normal Continent Weight: 78.5 kg (173 lb 1 oz) Height:  5' (152.4 cm)  BEHAVIORAL SYMPTOMS/MOOD NEUROLOGICAL BOWEL NUTRITION STATUS      Incontinent Diet (Please see DC Summary)  AMBULATORY STATUS COMMUNICATION OF NEEDS Skin   Limited Assist Verbally Normal                       Personal Care Assistance Level of Assistance  Bathing, Feeding, Dressing Bathing Assistance: Limited assistance Feeding assistance: Independent Dressing Assistance: Limited assistance     Functional Limitations Info  Sight, Hearing Sight Info: Impaired Hearing Info: Impaired      SPECIAL CARE FACTORS FREQUENCY  PT (By licensed PT)     PT Frequency: 5x/week              Contractures      Additional Factors Info  Code Status, Allergies, Insulin Sliding Scale Code Status Info: Full Allergies Info: NKA   Insulin Sliding Scale Info: 3 times daily with  meals       Current Medications (09/14/2016):  This is the current hospital active medication list Current Facility-Administered Medications  Medication Dose Route Frequency Provider Last Rate Last Dose  . acetaminophen (TYLENOL) tablet 650 mg  650 mg Oral Q6H PRN Eduard ClosArshad N Kakrakandy, MD       Or  . acetaminophen (TYLENOL) suppository 650 mg  650 mg Rectal Q6H PRN Eduard ClosArshad N Kakrakandy, MD      . amLODipine (NORVASC) tablet 2.5 mg  2.5 mg Oral Daily Eduard ClosArshad N Kakrakandy, MD   2.5 mg at 09/14/16 0940  . aspirin EC tablet 325 mg  325 mg Oral Daily Eduard ClosArshad N Kakrakandy, MD   325 mg at 09/14/16 0940  . enoxaparin (LOVENOX) injection 30 mg  30 mg Subcutaneous Q24H Eduard ClosArshad N Kakrakandy, MD   30 mg at 09/14/16 0940  . hydrALAZINE (APRESOLINE) injection 10 mg  10 mg Intravenous Q4H PRN Eduard ClosArshad N Kakrakandy, MD      . insulin aspart (novoLOG) injection 0-9 Units  0-9 Units Subcutaneous TID WC Eduard ClosArshad N Kakrakandy, MD   2 Units at 09/13/16 1806  . losartan (COZAAR) tablet 100 mg  100 mg Oral Daily Eduard ClosArshad N Kakrakandy, MD   100 mg at 09/14/16 0940  . metoprolol succinate (TOPROL-XL) 24 hr tablet 200 mg  200 mg Oral Daily Eduard ClosArshad N Kakrakandy, MD   200 mg at 09/14/16 0940     Discharge Medications: Please see  discharge summary for a list of discharge medications.  Relevant Imaging Results:  Relevant Lab Results:   Additional Information SSN: [id number] [place], [place]

## 2016-09-17 LAB — QUANTIFERON TB GOLD ASSAY (BLOOD)

## 2016-09-17 LAB — QUANTIFERON IN TUBE
QFT TB AG MINUS NIL VALUE: 0.02 IU/mL
QUANTIFERON MITOGEN VALUE: >10 IU/mL
QUANTIFERON NIL VALUE: 0.04 [IU]/mL
QUANTIFERON TB AG VALUE: 0.06 [IU]/mL
QUANTIFERON TB GOLD: NEGATIVE

## 2016-09-25 DIAGNOSIS — R296 Repeated falls: Secondary | ICD-10-CM | POA: Diagnosis not present

## 2016-09-25 DIAGNOSIS — I10 Essential (primary) hypertension: Secondary | ICD-10-CM | POA: Diagnosis not present

## 2016-09-25 DIAGNOSIS — M1990 Unspecified osteoarthritis, unspecified site: Secondary | ICD-10-CM | POA: Diagnosis not present

## 2016-09-25 DIAGNOSIS — M6281 Muscle weakness (generalized): Secondary | ICD-10-CM | POA: Diagnosis not present

## 2016-09-25 DIAGNOSIS — M79669 Pain in unspecified lower leg: Secondary | ICD-10-CM | POA: Diagnosis not present

## 2016-09-25 DIAGNOSIS — E119 Type 2 diabetes mellitus without complications: Secondary | ICD-10-CM | POA: Diagnosis not present

## 2016-09-27 DIAGNOSIS — R52 Pain, unspecified: Secondary | ICD-10-CM | POA: Diagnosis not present

## 2016-09-27 DIAGNOSIS — M25551 Pain in right hip: Secondary | ICD-10-CM | POA: Diagnosis not present

## 2016-09-27 DIAGNOSIS — M25569 Pain in unspecified knee: Secondary | ICD-10-CM | POA: Diagnosis not present

## 2016-09-27 DIAGNOSIS — Z7982 Long term (current) use of aspirin: Secondary | ICD-10-CM | POA: Diagnosis not present

## 2016-09-27 DIAGNOSIS — Z79899 Other long term (current) drug therapy: Secondary | ICD-10-CM | POA: Diagnosis not present

## 2016-09-27 DIAGNOSIS — Z043 Encounter for examination and observation following other accident: Secondary | ICD-10-CM | POA: Diagnosis not present

## 2016-09-27 DIAGNOSIS — I10 Essential (primary) hypertension: Secondary | ICD-10-CM | POA: Diagnosis not present

## 2016-09-27 DIAGNOSIS — S79911A Unspecified injury of right hip, initial encounter: Secondary | ICD-10-CM | POA: Diagnosis not present

## 2016-10-02 DIAGNOSIS — R5381 Other malaise: Secondary | ICD-10-CM | POA: Diagnosis not present

## 2016-10-02 DIAGNOSIS — R5383 Other fatigue: Secondary | ICD-10-CM | POA: Diagnosis not present

## 2016-10-02 DIAGNOSIS — I10 Essential (primary) hypertension: Secondary | ICD-10-CM | POA: Diagnosis not present

## 2016-10-02 DIAGNOSIS — M6281 Muscle weakness (generalized): Secondary | ICD-10-CM | POA: Diagnosis not present

## 2016-10-02 DIAGNOSIS — E119 Type 2 diabetes mellitus without complications: Secondary | ICD-10-CM | POA: Diagnosis not present

## 2016-10-02 DIAGNOSIS — W19XXXA Unspecified fall, initial encounter: Secondary | ICD-10-CM | POA: Diagnosis not present

## 2016-10-03 DIAGNOSIS — D649 Anemia, unspecified: Secondary | ICD-10-CM | POA: Diagnosis not present

## 2016-10-03 DIAGNOSIS — I10 Essential (primary) hypertension: Secondary | ICD-10-CM | POA: Diagnosis not present

## 2016-10-03 DIAGNOSIS — E119 Type 2 diabetes mellitus without complications: Secondary | ICD-10-CM | POA: Diagnosis not present

## 2016-10-03 DIAGNOSIS — E559 Vitamin D deficiency, unspecified: Secondary | ICD-10-CM | POA: Diagnosis not present

## 2016-10-03 DIAGNOSIS — H18812 Anesthesia and hypoesthesia of cornea, left eye: Secondary | ICD-10-CM | POA: Diagnosis not present

## 2016-10-03 DIAGNOSIS — R5381 Other malaise: Secondary | ICD-10-CM | POA: Diagnosis not present

## 2016-10-03 DIAGNOSIS — R410 Disorientation, unspecified: Secondary | ICD-10-CM | POA: Diagnosis not present

## 2016-10-03 DIAGNOSIS — R5383 Other fatigue: Secondary | ICD-10-CM | POA: Diagnosis not present

## 2016-10-05 DIAGNOSIS — E559 Vitamin D deficiency, unspecified: Secondary | ICD-10-CM | POA: Diagnosis not present

## 2016-10-05 DIAGNOSIS — R5383 Other fatigue: Secondary | ICD-10-CM | POA: Diagnosis not present

## 2016-10-05 DIAGNOSIS — R296 Repeated falls: Secondary | ICD-10-CM | POA: Diagnosis not present

## 2016-10-05 DIAGNOSIS — M6281 Muscle weakness (generalized): Secondary | ICD-10-CM | POA: Diagnosis not present

## 2016-10-05 DIAGNOSIS — Z9181 History of falling: Secondary | ICD-10-CM | POA: Diagnosis not present

## 2016-10-08 DIAGNOSIS — R2689 Other abnormalities of gait and mobility: Secondary | ICD-10-CM | POA: Diagnosis not present

## 2016-10-08 DIAGNOSIS — R296 Repeated falls: Secondary | ICD-10-CM | POA: Diagnosis not present

## 2016-10-08 DIAGNOSIS — I10 Essential (primary) hypertension: Secondary | ICD-10-CM | POA: Diagnosis not present

## 2016-10-08 DIAGNOSIS — M6281 Muscle weakness (generalized): Secondary | ICD-10-CM | POA: Diagnosis not present

## 2016-10-08 DIAGNOSIS — R41841 Cognitive communication deficit: Secondary | ICD-10-CM | POA: Diagnosis not present

## 2016-10-09 DIAGNOSIS — R296 Repeated falls: Secondary | ICD-10-CM | POA: Diagnosis not present

## 2016-10-09 DIAGNOSIS — I10 Essential (primary) hypertension: Secondary | ICD-10-CM | POA: Diagnosis not present

## 2016-10-09 DIAGNOSIS — M6281 Muscle weakness (generalized): Secondary | ICD-10-CM | POA: Diagnosis not present

## 2016-10-09 DIAGNOSIS — Z9181 History of falling: Secondary | ICD-10-CM | POA: Diagnosis not present

## 2016-10-09 DIAGNOSIS — R609 Edema, unspecified: Secondary | ICD-10-CM | POA: Diagnosis not present

## 2016-10-09 DIAGNOSIS — R5383 Other fatigue: Secondary | ICD-10-CM | POA: Diagnosis not present

## 2016-10-09 DIAGNOSIS — R2689 Other abnormalities of gait and mobility: Secondary | ICD-10-CM | POA: Diagnosis not present

## 2016-10-09 DIAGNOSIS — R41841 Cognitive communication deficit: Secondary | ICD-10-CM | POA: Diagnosis not present

## 2016-10-10 DIAGNOSIS — M25561 Pain in right knee: Secondary | ICD-10-CM | POA: Diagnosis not present

## 2016-10-10 DIAGNOSIS — I10 Essential (primary) hypertension: Secondary | ICD-10-CM | POA: Diagnosis not present

## 2016-10-10 DIAGNOSIS — Z79899 Other long term (current) drug therapy: Secondary | ICD-10-CM | POA: Diagnosis not present

## 2016-10-10 DIAGNOSIS — Z7982 Long term (current) use of aspirin: Secondary | ICD-10-CM | POA: Diagnosis not present

## 2016-10-10 DIAGNOSIS — M6281 Muscle weakness (generalized): Secondary | ICD-10-CM | POA: Diagnosis not present

## 2016-10-10 DIAGNOSIS — R41841 Cognitive communication deficit: Secondary | ICD-10-CM | POA: Diagnosis not present

## 2016-10-10 DIAGNOSIS — Z043 Encounter for examination and observation following other accident: Secondary | ICD-10-CM | POA: Diagnosis not present

## 2016-10-10 DIAGNOSIS — M25551 Pain in right hip: Secondary | ICD-10-CM | POA: Diagnosis not present

## 2016-10-10 DIAGNOSIS — I4510 Unspecified right bundle-branch block: Secondary | ICD-10-CM | POA: Diagnosis not present

## 2016-10-10 DIAGNOSIS — R296 Repeated falls: Secondary | ICD-10-CM | POA: Diagnosis not present

## 2016-10-10 DIAGNOSIS — R2689 Other abnormalities of gait and mobility: Secondary | ICD-10-CM | POA: Diagnosis not present

## 2016-10-11 DIAGNOSIS — R296 Repeated falls: Secondary | ICD-10-CM | POA: Diagnosis not present

## 2016-10-11 DIAGNOSIS — M6281 Muscle weakness (generalized): Secondary | ICD-10-CM | POA: Diagnosis not present

## 2016-10-11 DIAGNOSIS — R2689 Other abnormalities of gait and mobility: Secondary | ICD-10-CM | POA: Diagnosis not present

## 2016-10-12 DIAGNOSIS — R296 Repeated falls: Secondary | ICD-10-CM | POA: Diagnosis not present

## 2016-10-12 DIAGNOSIS — M6281 Muscle weakness (generalized): Secondary | ICD-10-CM | POA: Diagnosis not present

## 2016-10-12 DIAGNOSIS — R2689 Other abnormalities of gait and mobility: Secondary | ICD-10-CM | POA: Diagnosis not present

## 2016-10-15 DIAGNOSIS — R2689 Other abnormalities of gait and mobility: Secondary | ICD-10-CM | POA: Diagnosis not present

## 2016-10-15 DIAGNOSIS — R296 Repeated falls: Secondary | ICD-10-CM | POA: Diagnosis not present

## 2016-10-15 DIAGNOSIS — M6281 Muscle weakness (generalized): Secondary | ICD-10-CM | POA: Diagnosis not present

## 2016-10-16 DIAGNOSIS — R296 Repeated falls: Secondary | ICD-10-CM | POA: Diagnosis not present

## 2016-10-16 DIAGNOSIS — M6281 Muscle weakness (generalized): Secondary | ICD-10-CM | POA: Diagnosis not present

## 2016-10-16 DIAGNOSIS — R2689 Other abnormalities of gait and mobility: Secondary | ICD-10-CM | POA: Diagnosis not present

## 2016-10-18 DIAGNOSIS — R296 Repeated falls: Secondary | ICD-10-CM | POA: Diagnosis not present

## 2016-10-18 DIAGNOSIS — M6281 Muscle weakness (generalized): Secondary | ICD-10-CM | POA: Diagnosis not present

## 2016-10-18 DIAGNOSIS — R2689 Other abnormalities of gait and mobility: Secondary | ICD-10-CM | POA: Diagnosis not present

## 2016-10-19 DIAGNOSIS — R296 Repeated falls: Secondary | ICD-10-CM | POA: Diagnosis not present

## 2016-10-19 DIAGNOSIS — R2689 Other abnormalities of gait and mobility: Secondary | ICD-10-CM | POA: Diagnosis not present

## 2016-10-19 DIAGNOSIS — M6281 Muscle weakness (generalized): Secondary | ICD-10-CM | POA: Diagnosis not present

## 2016-10-22 DIAGNOSIS — R296 Repeated falls: Secondary | ICD-10-CM | POA: Diagnosis not present

## 2016-10-22 DIAGNOSIS — M6281 Muscle weakness (generalized): Secondary | ICD-10-CM | POA: Diagnosis not present

## 2016-10-22 DIAGNOSIS — R2689 Other abnormalities of gait and mobility: Secondary | ICD-10-CM | POA: Diagnosis not present

## 2016-10-23 DIAGNOSIS — R54 Age-related physical debility: Secondary | ICD-10-CM | POA: Diagnosis not present

## 2016-10-23 DIAGNOSIS — M6281 Muscle weakness (generalized): Secondary | ICD-10-CM | POA: Diagnosis not present

## 2016-10-23 DIAGNOSIS — R2689 Other abnormalities of gait and mobility: Secondary | ICD-10-CM | POA: Diagnosis not present

## 2016-10-23 DIAGNOSIS — R296 Repeated falls: Secondary | ICD-10-CM | POA: Diagnosis not present

## 2016-10-23 DIAGNOSIS — Z7409 Other reduced mobility: Secondary | ICD-10-CM | POA: Diagnosis not present

## 2016-10-24 DIAGNOSIS — R2689 Other abnormalities of gait and mobility: Secondary | ICD-10-CM | POA: Diagnosis not present

## 2016-10-24 DIAGNOSIS — M6281 Muscle weakness (generalized): Secondary | ICD-10-CM | POA: Diagnosis not present

## 2016-10-24 DIAGNOSIS — R296 Repeated falls: Secondary | ICD-10-CM | POA: Diagnosis not present

## 2016-10-25 DIAGNOSIS — M6281 Muscle weakness (generalized): Secondary | ICD-10-CM | POA: Diagnosis not present

## 2016-10-25 DIAGNOSIS — R2689 Other abnormalities of gait and mobility: Secondary | ICD-10-CM | POA: Diagnosis not present

## 2016-10-25 DIAGNOSIS — R296 Repeated falls: Secondary | ICD-10-CM | POA: Diagnosis not present

## 2016-10-26 DIAGNOSIS — R296 Repeated falls: Secondary | ICD-10-CM | POA: Diagnosis not present

## 2016-10-26 DIAGNOSIS — M6281 Muscle weakness (generalized): Secondary | ICD-10-CM | POA: Diagnosis not present

## 2016-10-26 DIAGNOSIS — R2689 Other abnormalities of gait and mobility: Secondary | ICD-10-CM | POA: Diagnosis not present

## 2016-10-29 DIAGNOSIS — R296 Repeated falls: Secondary | ICD-10-CM | POA: Diagnosis not present

## 2016-10-29 DIAGNOSIS — M6281 Muscle weakness (generalized): Secondary | ICD-10-CM | POA: Diagnosis not present

## 2016-10-29 DIAGNOSIS — R2689 Other abnormalities of gait and mobility: Secondary | ICD-10-CM | POA: Diagnosis not present

## 2016-10-30 DIAGNOSIS — R296 Repeated falls: Secondary | ICD-10-CM | POA: Diagnosis not present

## 2016-10-30 DIAGNOSIS — R2689 Other abnormalities of gait and mobility: Secondary | ICD-10-CM | POA: Diagnosis not present

## 2016-10-30 DIAGNOSIS — M6281 Muscle weakness (generalized): Secondary | ICD-10-CM | POA: Diagnosis not present

## 2016-10-30 DIAGNOSIS — R609 Edema, unspecified: Secondary | ICD-10-CM | POA: Diagnosis not present

## 2016-10-30 DIAGNOSIS — I509 Heart failure, unspecified: Secondary | ICD-10-CM | POA: Diagnosis not present

## 2016-11-01 DIAGNOSIS — R2689 Other abnormalities of gait and mobility: Secondary | ICD-10-CM | POA: Diagnosis not present

## 2016-11-01 DIAGNOSIS — R296 Repeated falls: Secondary | ICD-10-CM | POA: Diagnosis not present

## 2016-11-01 DIAGNOSIS — M6281 Muscle weakness (generalized): Secondary | ICD-10-CM | POA: Diagnosis not present

## 2016-11-02 DIAGNOSIS — R296 Repeated falls: Secondary | ICD-10-CM | POA: Diagnosis not present

## 2016-11-02 DIAGNOSIS — R2689 Other abnormalities of gait and mobility: Secondary | ICD-10-CM | POA: Diagnosis not present

## 2016-11-02 DIAGNOSIS — M6281 Muscle weakness (generalized): Secondary | ICD-10-CM | POA: Diagnosis not present

## 2016-11-05 DIAGNOSIS — R296 Repeated falls: Secondary | ICD-10-CM | POA: Diagnosis not present

## 2016-11-05 DIAGNOSIS — R2689 Other abnormalities of gait and mobility: Secondary | ICD-10-CM | POA: Diagnosis not present

## 2016-11-05 DIAGNOSIS — M6281 Muscle weakness (generalized): Secondary | ICD-10-CM | POA: Diagnosis not present

## 2016-11-06 DIAGNOSIS — R2689 Other abnormalities of gait and mobility: Secondary | ICD-10-CM | POA: Diagnosis not present

## 2016-11-06 DIAGNOSIS — M6281 Muscle weakness (generalized): Secondary | ICD-10-CM | POA: Diagnosis not present

## 2016-11-06 DIAGNOSIS — R296 Repeated falls: Secondary | ICD-10-CM | POA: Diagnosis not present

## 2016-11-07 DIAGNOSIS — R296 Repeated falls: Secondary | ICD-10-CM | POA: Diagnosis not present

## 2016-11-07 DIAGNOSIS — R2689 Other abnormalities of gait and mobility: Secondary | ICD-10-CM | POA: Diagnosis not present

## 2016-11-07 DIAGNOSIS — M6281 Muscle weakness (generalized): Secondary | ICD-10-CM | POA: Diagnosis not present

## 2016-11-08 DIAGNOSIS — R296 Repeated falls: Secondary | ICD-10-CM | POA: Diagnosis not present

## 2016-11-08 DIAGNOSIS — M6281 Muscle weakness (generalized): Secondary | ICD-10-CM | POA: Diagnosis not present

## 2016-11-08 DIAGNOSIS — R2689 Other abnormalities of gait and mobility: Secondary | ICD-10-CM | POA: Diagnosis not present

## 2016-11-09 DIAGNOSIS — M6281 Muscle weakness (generalized): Secondary | ICD-10-CM | POA: Diagnosis not present

## 2016-11-09 DIAGNOSIS — R296 Repeated falls: Secondary | ICD-10-CM | POA: Diagnosis not present

## 2016-11-09 DIAGNOSIS — R2689 Other abnormalities of gait and mobility: Secondary | ICD-10-CM | POA: Diagnosis not present

## 2016-11-12 DIAGNOSIS — R2689 Other abnormalities of gait and mobility: Secondary | ICD-10-CM | POA: Diagnosis not present

## 2016-11-12 DIAGNOSIS — R296 Repeated falls: Secondary | ICD-10-CM | POA: Diagnosis not present

## 2016-11-12 DIAGNOSIS — M6281 Muscle weakness (generalized): Secondary | ICD-10-CM | POA: Diagnosis not present

## 2016-11-13 DIAGNOSIS — M6281 Muscle weakness (generalized): Secondary | ICD-10-CM | POA: Diagnosis not present

## 2016-11-13 DIAGNOSIS — F0390 Unspecified dementia without behavioral disturbance: Secondary | ICD-10-CM | POA: Diagnosis not present

## 2016-11-13 DIAGNOSIS — R54 Age-related physical debility: Secondary | ICD-10-CM | POA: Diagnosis not present

## 2016-11-13 DIAGNOSIS — R627 Adult failure to thrive: Secondary | ICD-10-CM | POA: Diagnosis not present

## 2016-11-13 DIAGNOSIS — R2689 Other abnormalities of gait and mobility: Secondary | ICD-10-CM | POA: Diagnosis not present

## 2016-11-13 DIAGNOSIS — R296 Repeated falls: Secondary | ICD-10-CM | POA: Diagnosis not present

## 2016-11-14 DIAGNOSIS — M6281 Muscle weakness (generalized): Secondary | ICD-10-CM | POA: Diagnosis not present

## 2016-11-14 DIAGNOSIS — R296 Repeated falls: Secondary | ICD-10-CM | POA: Diagnosis not present

## 2016-11-14 DIAGNOSIS — R2689 Other abnormalities of gait and mobility: Secondary | ICD-10-CM | POA: Diagnosis not present

## 2016-11-15 DIAGNOSIS — R2689 Other abnormalities of gait and mobility: Secondary | ICD-10-CM | POA: Diagnosis not present

## 2016-11-15 DIAGNOSIS — R296 Repeated falls: Secondary | ICD-10-CM | POA: Diagnosis not present

## 2016-11-15 DIAGNOSIS — M6281 Muscle weakness (generalized): Secondary | ICD-10-CM | POA: Diagnosis not present

## 2016-11-16 DIAGNOSIS — M6281 Muscle weakness (generalized): Secondary | ICD-10-CM | POA: Diagnosis not present

## 2016-11-16 DIAGNOSIS — R2689 Other abnormalities of gait and mobility: Secondary | ICD-10-CM | POA: Diagnosis not present

## 2016-11-16 DIAGNOSIS — R296 Repeated falls: Secondary | ICD-10-CM | POA: Diagnosis not present

## 2016-11-19 DIAGNOSIS — R296 Repeated falls: Secondary | ICD-10-CM | POA: Diagnosis not present

## 2016-11-19 DIAGNOSIS — R2689 Other abnormalities of gait and mobility: Secondary | ICD-10-CM | POA: Diagnosis not present

## 2016-11-19 DIAGNOSIS — M6281 Muscle weakness (generalized): Secondary | ICD-10-CM | POA: Diagnosis not present

## 2016-11-20 DIAGNOSIS — M6281 Muscle weakness (generalized): Secondary | ICD-10-CM | POA: Diagnosis not present

## 2016-11-20 DIAGNOSIS — R296 Repeated falls: Secondary | ICD-10-CM | POA: Diagnosis not present

## 2016-11-20 DIAGNOSIS — R2689 Other abnormalities of gait and mobility: Secondary | ICD-10-CM | POA: Diagnosis not present

## 2016-11-21 DIAGNOSIS — R2689 Other abnormalities of gait and mobility: Secondary | ICD-10-CM | POA: Diagnosis not present

## 2016-11-21 DIAGNOSIS — M6281 Muscle weakness (generalized): Secondary | ICD-10-CM | POA: Diagnosis not present

## 2016-11-21 DIAGNOSIS — R296 Repeated falls: Secondary | ICD-10-CM | POA: Diagnosis not present

## 2016-11-22 DIAGNOSIS — R296 Repeated falls: Secondary | ICD-10-CM | POA: Diagnosis not present

## 2016-11-22 DIAGNOSIS — M6281 Muscle weakness (generalized): Secondary | ICD-10-CM | POA: Diagnosis not present

## 2016-11-22 DIAGNOSIS — R2689 Other abnormalities of gait and mobility: Secondary | ICD-10-CM | POA: Diagnosis not present

## 2016-11-23 DIAGNOSIS — R2689 Other abnormalities of gait and mobility: Secondary | ICD-10-CM | POA: Diagnosis not present

## 2016-11-23 DIAGNOSIS — R296 Repeated falls: Secondary | ICD-10-CM | POA: Diagnosis not present

## 2016-11-23 DIAGNOSIS — M6281 Muscle weakness (generalized): Secondary | ICD-10-CM | POA: Diagnosis not present

## 2016-11-26 DIAGNOSIS — R296 Repeated falls: Secondary | ICD-10-CM | POA: Diagnosis not present

## 2016-11-26 DIAGNOSIS — M6281 Muscle weakness (generalized): Secondary | ICD-10-CM | POA: Diagnosis not present

## 2016-11-26 DIAGNOSIS — R2689 Other abnormalities of gait and mobility: Secondary | ICD-10-CM | POA: Diagnosis not present

## 2016-11-27 DIAGNOSIS — R2689 Other abnormalities of gait and mobility: Secondary | ICD-10-CM | POA: Diagnosis not present

## 2016-11-27 DIAGNOSIS — R296 Repeated falls: Secondary | ICD-10-CM | POA: Diagnosis not present

## 2016-11-27 DIAGNOSIS — M6281 Muscle weakness (generalized): Secondary | ICD-10-CM | POA: Diagnosis not present

## 2016-11-28 DIAGNOSIS — M6281 Muscle weakness (generalized): Secondary | ICD-10-CM | POA: Diagnosis not present

## 2016-11-28 DIAGNOSIS — R296 Repeated falls: Secondary | ICD-10-CM | POA: Diagnosis not present

## 2016-11-28 DIAGNOSIS — R2689 Other abnormalities of gait and mobility: Secondary | ICD-10-CM | POA: Diagnosis not present

## 2016-11-29 DIAGNOSIS — R2689 Other abnormalities of gait and mobility: Secondary | ICD-10-CM | POA: Diagnosis not present

## 2016-11-29 DIAGNOSIS — M6281 Muscle weakness (generalized): Secondary | ICD-10-CM | POA: Diagnosis not present

## 2016-11-29 DIAGNOSIS — R296 Repeated falls: Secondary | ICD-10-CM | POA: Diagnosis not present

## 2016-11-30 DIAGNOSIS — R2689 Other abnormalities of gait and mobility: Secondary | ICD-10-CM | POA: Diagnosis not present

## 2016-11-30 DIAGNOSIS — M6281 Muscle weakness (generalized): Secondary | ICD-10-CM | POA: Diagnosis not present

## 2016-11-30 DIAGNOSIS — R296 Repeated falls: Secondary | ICD-10-CM | POA: Diagnosis not present

## 2016-12-01 DIAGNOSIS — M6281 Muscle weakness (generalized): Secondary | ICD-10-CM | POA: Diagnosis not present

## 2016-12-01 DIAGNOSIS — R296 Repeated falls: Secondary | ICD-10-CM | POA: Diagnosis not present

## 2016-12-01 DIAGNOSIS — R2689 Other abnormalities of gait and mobility: Secondary | ICD-10-CM | POA: Diagnosis not present

## 2016-12-03 DIAGNOSIS — R2689 Other abnormalities of gait and mobility: Secondary | ICD-10-CM | POA: Diagnosis not present

## 2016-12-03 DIAGNOSIS — M6281 Muscle weakness (generalized): Secondary | ICD-10-CM | POA: Diagnosis not present

## 2016-12-03 DIAGNOSIS — R296 Repeated falls: Secondary | ICD-10-CM | POA: Diagnosis not present

## 2016-12-04 DIAGNOSIS — R296 Repeated falls: Secondary | ICD-10-CM | POA: Diagnosis not present

## 2016-12-04 DIAGNOSIS — M6281 Muscle weakness (generalized): Secondary | ICD-10-CM | POA: Diagnosis not present

## 2016-12-04 DIAGNOSIS — R2689 Other abnormalities of gait and mobility: Secondary | ICD-10-CM | POA: Diagnosis not present

## 2016-12-06 DIAGNOSIS — R296 Repeated falls: Secondary | ICD-10-CM | POA: Diagnosis not present

## 2016-12-06 DIAGNOSIS — R2689 Other abnormalities of gait and mobility: Secondary | ICD-10-CM | POA: Diagnosis not present

## 2016-12-06 DIAGNOSIS — M6281 Muscle weakness (generalized): Secondary | ICD-10-CM | POA: Diagnosis not present

## 2016-12-07 DIAGNOSIS — R296 Repeated falls: Secondary | ICD-10-CM | POA: Diagnosis not present

## 2016-12-07 DIAGNOSIS — R2689 Other abnormalities of gait and mobility: Secondary | ICD-10-CM | POA: Diagnosis not present

## 2016-12-07 DIAGNOSIS — M6281 Muscle weakness (generalized): Secondary | ICD-10-CM | POA: Diagnosis not present

## 2016-12-10 DIAGNOSIS — R2689 Other abnormalities of gait and mobility: Secondary | ICD-10-CM | POA: Diagnosis not present

## 2016-12-10 DIAGNOSIS — M6281 Muscle weakness (generalized): Secondary | ICD-10-CM | POA: Diagnosis not present

## 2016-12-10 DIAGNOSIS — R296 Repeated falls: Secondary | ICD-10-CM | POA: Diagnosis not present

## 2016-12-11 DIAGNOSIS — R296 Repeated falls: Secondary | ICD-10-CM | POA: Diagnosis not present

## 2016-12-11 DIAGNOSIS — R2689 Other abnormalities of gait and mobility: Secondary | ICD-10-CM | POA: Diagnosis not present

## 2016-12-11 DIAGNOSIS — M6281 Muscle weakness (generalized): Secondary | ICD-10-CM | POA: Diagnosis not present

## 2016-12-13 DIAGNOSIS — R296 Repeated falls: Secondary | ICD-10-CM | POA: Diagnosis not present

## 2016-12-13 DIAGNOSIS — M6281 Muscle weakness (generalized): Secondary | ICD-10-CM | POA: Diagnosis not present

## 2016-12-13 DIAGNOSIS — R2689 Other abnormalities of gait and mobility: Secondary | ICD-10-CM | POA: Diagnosis not present

## 2016-12-14 DIAGNOSIS — R2689 Other abnormalities of gait and mobility: Secondary | ICD-10-CM | POA: Diagnosis not present

## 2016-12-14 DIAGNOSIS — M6281 Muscle weakness (generalized): Secondary | ICD-10-CM | POA: Diagnosis not present

## 2016-12-14 DIAGNOSIS — R296 Repeated falls: Secondary | ICD-10-CM | POA: Diagnosis not present

## 2016-12-17 DIAGNOSIS — R2689 Other abnormalities of gait and mobility: Secondary | ICD-10-CM | POA: Diagnosis not present

## 2016-12-17 DIAGNOSIS — M6281 Muscle weakness (generalized): Secondary | ICD-10-CM | POA: Diagnosis not present

## 2016-12-17 DIAGNOSIS — R296 Repeated falls: Secondary | ICD-10-CM | POA: Diagnosis not present

## 2016-12-18 DIAGNOSIS — M6281 Muscle weakness (generalized): Secondary | ICD-10-CM | POA: Diagnosis not present

## 2016-12-18 DIAGNOSIS — R2689 Other abnormalities of gait and mobility: Secondary | ICD-10-CM | POA: Diagnosis not present

## 2016-12-18 DIAGNOSIS — R296 Repeated falls: Secondary | ICD-10-CM | POA: Diagnosis not present

## 2016-12-19 DIAGNOSIS — M6281 Muscle weakness (generalized): Secondary | ICD-10-CM | POA: Diagnosis not present

## 2016-12-19 DIAGNOSIS — R2689 Other abnormalities of gait and mobility: Secondary | ICD-10-CM | POA: Diagnosis not present

## 2016-12-19 DIAGNOSIS — R296 Repeated falls: Secondary | ICD-10-CM | POA: Diagnosis not present

## 2016-12-20 DIAGNOSIS — M6281 Muscle weakness (generalized): Secondary | ICD-10-CM | POA: Diagnosis not present

## 2016-12-20 DIAGNOSIS — R2689 Other abnormalities of gait and mobility: Secondary | ICD-10-CM | POA: Diagnosis not present

## 2016-12-20 DIAGNOSIS — R296 Repeated falls: Secondary | ICD-10-CM | POA: Diagnosis not present

## 2016-12-21 DIAGNOSIS — R296 Repeated falls: Secondary | ICD-10-CM | POA: Diagnosis not present

## 2016-12-21 DIAGNOSIS — M6281 Muscle weakness (generalized): Secondary | ICD-10-CM | POA: Diagnosis not present

## 2016-12-21 DIAGNOSIS — R2689 Other abnormalities of gait and mobility: Secondary | ICD-10-CM | POA: Diagnosis not present

## 2016-12-24 DIAGNOSIS — R2689 Other abnormalities of gait and mobility: Secondary | ICD-10-CM | POA: Diagnosis not present

## 2016-12-24 DIAGNOSIS — R296 Repeated falls: Secondary | ICD-10-CM | POA: Diagnosis not present

## 2016-12-24 DIAGNOSIS — M6281 Muscle weakness (generalized): Secondary | ICD-10-CM | POA: Diagnosis not present

## 2016-12-25 DIAGNOSIS — R296 Repeated falls: Secondary | ICD-10-CM | POA: Diagnosis not present

## 2016-12-25 DIAGNOSIS — M6281 Muscle weakness (generalized): Secondary | ICD-10-CM | POA: Diagnosis not present

## 2016-12-25 DIAGNOSIS — R2689 Other abnormalities of gait and mobility: Secondary | ICD-10-CM | POA: Diagnosis not present

## 2016-12-26 DIAGNOSIS — R2689 Other abnormalities of gait and mobility: Secondary | ICD-10-CM | POA: Diagnosis not present

## 2016-12-26 DIAGNOSIS — M6281 Muscle weakness (generalized): Secondary | ICD-10-CM | POA: Diagnosis not present

## 2016-12-26 DIAGNOSIS — R296 Repeated falls: Secondary | ICD-10-CM | POA: Diagnosis not present

## 2016-12-27 DIAGNOSIS — R296 Repeated falls: Secondary | ICD-10-CM | POA: Diagnosis not present

## 2016-12-27 DIAGNOSIS — M6281 Muscle weakness (generalized): Secondary | ICD-10-CM | POA: Diagnosis not present

## 2016-12-27 DIAGNOSIS — R2689 Other abnormalities of gait and mobility: Secondary | ICD-10-CM | POA: Diagnosis not present

## 2016-12-28 DIAGNOSIS — R2689 Other abnormalities of gait and mobility: Secondary | ICD-10-CM | POA: Diagnosis not present

## 2016-12-28 DIAGNOSIS — M6281 Muscle weakness (generalized): Secondary | ICD-10-CM | POA: Diagnosis not present

## 2016-12-28 DIAGNOSIS — R296 Repeated falls: Secondary | ICD-10-CM | POA: Diagnosis not present

## 2016-12-30 DIAGNOSIS — M6281 Muscle weakness (generalized): Secondary | ICD-10-CM | POA: Diagnosis not present

## 2016-12-30 DIAGNOSIS — R2689 Other abnormalities of gait and mobility: Secondary | ICD-10-CM | POA: Diagnosis not present

## 2016-12-30 DIAGNOSIS — R296 Repeated falls: Secondary | ICD-10-CM | POA: Diagnosis not present

## 2016-12-31 DIAGNOSIS — M6281 Muscle weakness (generalized): Secondary | ICD-10-CM | POA: Diagnosis not present

## 2016-12-31 DIAGNOSIS — R296 Repeated falls: Secondary | ICD-10-CM | POA: Diagnosis not present

## 2016-12-31 DIAGNOSIS — R2689 Other abnormalities of gait and mobility: Secondary | ICD-10-CM | POA: Diagnosis not present

## 2017-01-01 DIAGNOSIS — R2689 Other abnormalities of gait and mobility: Secondary | ICD-10-CM | POA: Diagnosis not present

## 2017-01-01 DIAGNOSIS — R296 Repeated falls: Secondary | ICD-10-CM | POA: Diagnosis not present

## 2017-01-01 DIAGNOSIS — M6281 Muscle weakness (generalized): Secondary | ICD-10-CM | POA: Diagnosis not present

## 2017-01-03 DIAGNOSIS — R2689 Other abnormalities of gait and mobility: Secondary | ICD-10-CM | POA: Diagnosis not present

## 2017-01-03 DIAGNOSIS — R296 Repeated falls: Secondary | ICD-10-CM | POA: Diagnosis not present

## 2017-01-03 DIAGNOSIS — M6281 Muscle weakness (generalized): Secondary | ICD-10-CM | POA: Diagnosis not present

## 2017-01-04 DIAGNOSIS — R296 Repeated falls: Secondary | ICD-10-CM | POA: Diagnosis not present

## 2017-01-04 DIAGNOSIS — R2689 Other abnormalities of gait and mobility: Secondary | ICD-10-CM | POA: Diagnosis not present

## 2017-01-04 DIAGNOSIS — M6281 Muscle weakness (generalized): Secondary | ICD-10-CM | POA: Diagnosis not present

## 2017-01-07 DIAGNOSIS — M6281 Muscle weakness (generalized): Secondary | ICD-10-CM | POA: Diagnosis not present

## 2017-01-07 DIAGNOSIS — R2689 Other abnormalities of gait and mobility: Secondary | ICD-10-CM | POA: Diagnosis not present

## 2017-01-07 DIAGNOSIS — R296 Repeated falls: Secondary | ICD-10-CM | POA: Diagnosis not present

## 2017-01-08 DIAGNOSIS — R296 Repeated falls: Secondary | ICD-10-CM | POA: Diagnosis not present

## 2017-01-08 DIAGNOSIS — M6281 Muscle weakness (generalized): Secondary | ICD-10-CM | POA: Diagnosis not present

## 2017-01-08 DIAGNOSIS — R2689 Other abnormalities of gait and mobility: Secondary | ICD-10-CM | POA: Diagnosis not present

## 2017-01-10 DIAGNOSIS — R296 Repeated falls: Secondary | ICD-10-CM | POA: Diagnosis not present

## 2017-01-10 DIAGNOSIS — R2689 Other abnormalities of gait and mobility: Secondary | ICD-10-CM | POA: Diagnosis not present

## 2017-01-10 DIAGNOSIS — M6281 Muscle weakness (generalized): Secondary | ICD-10-CM | POA: Diagnosis not present

## 2017-01-14 DIAGNOSIS — M6281 Muscle weakness (generalized): Secondary | ICD-10-CM | POA: Diagnosis not present

## 2017-01-14 DIAGNOSIS — R2689 Other abnormalities of gait and mobility: Secondary | ICD-10-CM | POA: Diagnosis not present

## 2017-01-14 DIAGNOSIS — R296 Repeated falls: Secondary | ICD-10-CM | POA: Diagnosis not present

## 2017-01-15 DIAGNOSIS — R296 Repeated falls: Secondary | ICD-10-CM | POA: Diagnosis not present

## 2017-01-15 DIAGNOSIS — M6281 Muscle weakness (generalized): Secondary | ICD-10-CM | POA: Diagnosis not present

## 2017-01-15 DIAGNOSIS — R2689 Other abnormalities of gait and mobility: Secondary | ICD-10-CM | POA: Diagnosis not present

## 2017-01-17 DIAGNOSIS — M6281 Muscle weakness (generalized): Secondary | ICD-10-CM | POA: Diagnosis not present

## 2017-01-17 DIAGNOSIS — R2689 Other abnormalities of gait and mobility: Secondary | ICD-10-CM | POA: Diagnosis not present

## 2017-01-17 DIAGNOSIS — R296 Repeated falls: Secondary | ICD-10-CM | POA: Diagnosis not present

## 2017-01-20 DIAGNOSIS — M6281 Muscle weakness (generalized): Secondary | ICD-10-CM | POA: Diagnosis not present

## 2017-01-20 DIAGNOSIS — R2689 Other abnormalities of gait and mobility: Secondary | ICD-10-CM | POA: Diagnosis not present

## 2017-01-20 DIAGNOSIS — R296 Repeated falls: Secondary | ICD-10-CM | POA: Diagnosis not present

## 2017-01-21 DIAGNOSIS — M6281 Muscle weakness (generalized): Secondary | ICD-10-CM | POA: Diagnosis not present

## 2017-01-21 DIAGNOSIS — R2689 Other abnormalities of gait and mobility: Secondary | ICD-10-CM | POA: Diagnosis not present

## 2017-01-21 DIAGNOSIS — R296 Repeated falls: Secondary | ICD-10-CM | POA: Diagnosis not present

## 2017-01-22 DIAGNOSIS — R296 Repeated falls: Secondary | ICD-10-CM | POA: Diagnosis not present

## 2017-01-22 DIAGNOSIS — M6281 Muscle weakness (generalized): Secondary | ICD-10-CM | POA: Diagnosis not present

## 2017-01-22 DIAGNOSIS — E119 Type 2 diabetes mellitus without complications: Secondary | ICD-10-CM | POA: Diagnosis not present

## 2017-01-22 DIAGNOSIS — I10 Essential (primary) hypertension: Secondary | ICD-10-CM | POA: Diagnosis not present

## 2017-01-22 DIAGNOSIS — M1990 Unspecified osteoarthritis, unspecified site: Secondary | ICD-10-CM | POA: Diagnosis not present

## 2017-01-22 DIAGNOSIS — R2689 Other abnormalities of gait and mobility: Secondary | ICD-10-CM | POA: Diagnosis not present

## 2017-01-28 DIAGNOSIS — R296 Repeated falls: Secondary | ICD-10-CM | POA: Diagnosis not present

## 2017-01-28 DIAGNOSIS — R2689 Other abnormalities of gait and mobility: Secondary | ICD-10-CM | POA: Diagnosis not present

## 2017-01-28 DIAGNOSIS — M6281 Muscle weakness (generalized): Secondary | ICD-10-CM | POA: Diagnosis not present

## 2017-01-30 DIAGNOSIS — R296 Repeated falls: Secondary | ICD-10-CM | POA: Diagnosis not present

## 2017-01-30 DIAGNOSIS — R2689 Other abnormalities of gait and mobility: Secondary | ICD-10-CM | POA: Diagnosis not present

## 2017-01-30 DIAGNOSIS — M6281 Muscle weakness (generalized): Secondary | ICD-10-CM | POA: Diagnosis not present

## 2017-01-31 DIAGNOSIS — M6281 Muscle weakness (generalized): Secondary | ICD-10-CM | POA: Diagnosis not present

## 2017-01-31 DIAGNOSIS — R296 Repeated falls: Secondary | ICD-10-CM | POA: Diagnosis not present

## 2017-01-31 DIAGNOSIS — R2689 Other abnormalities of gait and mobility: Secondary | ICD-10-CM | POA: Diagnosis not present

## 2017-02-04 DIAGNOSIS — R296 Repeated falls: Secondary | ICD-10-CM | POA: Diagnosis not present

## 2017-02-04 DIAGNOSIS — R2689 Other abnormalities of gait and mobility: Secondary | ICD-10-CM | POA: Diagnosis not present

## 2017-02-04 DIAGNOSIS — M6281 Muscle weakness (generalized): Secondary | ICD-10-CM | POA: Diagnosis not present

## 2017-02-05 DIAGNOSIS — R296 Repeated falls: Secondary | ICD-10-CM | POA: Diagnosis not present

## 2017-02-05 DIAGNOSIS — M6281 Muscle weakness (generalized): Secondary | ICD-10-CM | POA: Diagnosis not present

## 2017-02-05 DIAGNOSIS — R2689 Other abnormalities of gait and mobility: Secondary | ICD-10-CM | POA: Diagnosis not present

## 2017-02-07 DIAGNOSIS — R2689 Other abnormalities of gait and mobility: Secondary | ICD-10-CM | POA: Diagnosis not present

## 2017-02-07 DIAGNOSIS — M6281 Muscle weakness (generalized): Secondary | ICD-10-CM | POA: Diagnosis not present

## 2017-02-07 DIAGNOSIS — R296 Repeated falls: Secondary | ICD-10-CM | POA: Diagnosis not present

## 2017-02-11 DIAGNOSIS — R296 Repeated falls: Secondary | ICD-10-CM | POA: Diagnosis not present

## 2017-02-11 DIAGNOSIS — R2689 Other abnormalities of gait and mobility: Secondary | ICD-10-CM | POA: Diagnosis not present

## 2017-02-11 DIAGNOSIS — M6281 Muscle weakness (generalized): Secondary | ICD-10-CM | POA: Diagnosis not present

## 2017-02-12 DIAGNOSIS — R2689 Other abnormalities of gait and mobility: Secondary | ICD-10-CM | POA: Diagnosis not present

## 2017-02-12 DIAGNOSIS — M6281 Muscle weakness (generalized): Secondary | ICD-10-CM | POA: Diagnosis not present

## 2017-02-12 DIAGNOSIS — R296 Repeated falls: Secondary | ICD-10-CM | POA: Diagnosis not present

## 2017-02-14 DIAGNOSIS — R296 Repeated falls: Secondary | ICD-10-CM | POA: Diagnosis not present

## 2017-02-14 DIAGNOSIS — M6281 Muscle weakness (generalized): Secondary | ICD-10-CM | POA: Diagnosis not present

## 2017-02-14 DIAGNOSIS — R2689 Other abnormalities of gait and mobility: Secondary | ICD-10-CM | POA: Diagnosis not present

## 2017-02-18 DIAGNOSIS — M6281 Muscle weakness (generalized): Secondary | ICD-10-CM | POA: Diagnosis not present

## 2017-02-18 DIAGNOSIS — R2689 Other abnormalities of gait and mobility: Secondary | ICD-10-CM | POA: Diagnosis not present

## 2017-02-18 DIAGNOSIS — R296 Repeated falls: Secondary | ICD-10-CM | POA: Diagnosis not present

## 2017-02-20 DIAGNOSIS — R296 Repeated falls: Secondary | ICD-10-CM | POA: Diagnosis not present

## 2017-02-20 DIAGNOSIS — M6281 Muscle weakness (generalized): Secondary | ICD-10-CM | POA: Diagnosis not present

## 2017-02-20 DIAGNOSIS — R2689 Other abnormalities of gait and mobility: Secondary | ICD-10-CM | POA: Diagnosis not present

## 2017-02-21 DIAGNOSIS — R2689 Other abnormalities of gait and mobility: Secondary | ICD-10-CM | POA: Diagnosis not present

## 2017-02-21 DIAGNOSIS — R296 Repeated falls: Secondary | ICD-10-CM | POA: Diagnosis not present

## 2017-02-21 DIAGNOSIS — M6281 Muscle weakness (generalized): Secondary | ICD-10-CM | POA: Diagnosis not present

## 2017-02-25 DIAGNOSIS — R2689 Other abnormalities of gait and mobility: Secondary | ICD-10-CM | POA: Diagnosis not present

## 2017-02-25 DIAGNOSIS — M6281 Muscle weakness (generalized): Secondary | ICD-10-CM | POA: Diagnosis not present

## 2017-02-25 DIAGNOSIS — R296 Repeated falls: Secondary | ICD-10-CM | POA: Diagnosis not present

## 2017-02-26 DIAGNOSIS — M6281 Muscle weakness (generalized): Secondary | ICD-10-CM | POA: Diagnosis not present

## 2017-02-26 DIAGNOSIS — R2689 Other abnormalities of gait and mobility: Secondary | ICD-10-CM | POA: Diagnosis not present

## 2017-02-26 DIAGNOSIS — R296 Repeated falls: Secondary | ICD-10-CM | POA: Diagnosis not present

## 2017-02-28 DIAGNOSIS — R296 Repeated falls: Secondary | ICD-10-CM | POA: Diagnosis not present

## 2017-02-28 DIAGNOSIS — R2689 Other abnormalities of gait and mobility: Secondary | ICD-10-CM | POA: Diagnosis not present

## 2017-02-28 DIAGNOSIS — M6281 Muscle weakness (generalized): Secondary | ICD-10-CM | POA: Diagnosis not present

## 2017-03-04 DIAGNOSIS — M6281 Muscle weakness (generalized): Secondary | ICD-10-CM | POA: Diagnosis not present

## 2017-03-04 DIAGNOSIS — R2689 Other abnormalities of gait and mobility: Secondary | ICD-10-CM | POA: Diagnosis not present

## 2017-03-04 DIAGNOSIS — R296 Repeated falls: Secondary | ICD-10-CM | POA: Diagnosis not present

## 2017-03-05 DIAGNOSIS — R296 Repeated falls: Secondary | ICD-10-CM | POA: Diagnosis not present

## 2017-03-05 DIAGNOSIS — M6281 Muscle weakness (generalized): Secondary | ICD-10-CM | POA: Diagnosis not present

## 2017-03-05 DIAGNOSIS — R2689 Other abnormalities of gait and mobility: Secondary | ICD-10-CM | POA: Diagnosis not present

## 2017-03-06 DIAGNOSIS — M6281 Muscle weakness (generalized): Secondary | ICD-10-CM | POA: Diagnosis not present

## 2017-03-06 DIAGNOSIS — R296 Repeated falls: Secondary | ICD-10-CM | POA: Diagnosis not present

## 2017-03-06 DIAGNOSIS — R2689 Other abnormalities of gait and mobility: Secondary | ICD-10-CM | POA: Diagnosis not present

## 2017-03-11 DIAGNOSIS — R296 Repeated falls: Secondary | ICD-10-CM | POA: Diagnosis not present

## 2017-03-11 DIAGNOSIS — M6281 Muscle weakness (generalized): Secondary | ICD-10-CM | POA: Diagnosis not present

## 2017-03-11 DIAGNOSIS — R2689 Other abnormalities of gait and mobility: Secondary | ICD-10-CM | POA: Diagnosis not present

## 2017-03-12 DIAGNOSIS — R296 Repeated falls: Secondary | ICD-10-CM | POA: Diagnosis not present

## 2017-03-12 DIAGNOSIS — M6281 Muscle weakness (generalized): Secondary | ICD-10-CM | POA: Diagnosis not present

## 2017-03-12 DIAGNOSIS — R2689 Other abnormalities of gait and mobility: Secondary | ICD-10-CM | POA: Diagnosis not present

## 2017-03-14 DIAGNOSIS — R296 Repeated falls: Secondary | ICD-10-CM | POA: Diagnosis not present

## 2017-03-14 DIAGNOSIS — R2689 Other abnormalities of gait and mobility: Secondary | ICD-10-CM | POA: Diagnosis not present

## 2017-03-14 DIAGNOSIS — M6281 Muscle weakness (generalized): Secondary | ICD-10-CM | POA: Diagnosis not present

## 2017-03-18 DIAGNOSIS — R2689 Other abnormalities of gait and mobility: Secondary | ICD-10-CM | POA: Diagnosis not present

## 2017-03-18 DIAGNOSIS — R296 Repeated falls: Secondary | ICD-10-CM | POA: Diagnosis not present

## 2017-03-18 DIAGNOSIS — M6281 Muscle weakness (generalized): Secondary | ICD-10-CM | POA: Diagnosis not present

## 2017-03-21 DIAGNOSIS — M6281 Muscle weakness (generalized): Secondary | ICD-10-CM | POA: Diagnosis not present

## 2017-03-21 DIAGNOSIS — R2689 Other abnormalities of gait and mobility: Secondary | ICD-10-CM | POA: Diagnosis not present

## 2017-03-21 DIAGNOSIS — R296 Repeated falls: Secondary | ICD-10-CM | POA: Diagnosis not present

## 2017-03-25 DIAGNOSIS — M6281 Muscle weakness (generalized): Secondary | ICD-10-CM | POA: Diagnosis not present

## 2017-03-25 DIAGNOSIS — R296 Repeated falls: Secondary | ICD-10-CM | POA: Diagnosis not present

## 2017-03-25 DIAGNOSIS — R2689 Other abnormalities of gait and mobility: Secondary | ICD-10-CM | POA: Diagnosis not present

## 2017-03-28 DIAGNOSIS — M6281 Muscle weakness (generalized): Secondary | ICD-10-CM | POA: Diagnosis not present

## 2017-03-28 DIAGNOSIS — R296 Repeated falls: Secondary | ICD-10-CM | POA: Diagnosis not present

## 2017-03-28 DIAGNOSIS — R2689 Other abnormalities of gait and mobility: Secondary | ICD-10-CM | POA: Diagnosis not present

## 2017-03-29 DIAGNOSIS — E1140 Type 2 diabetes mellitus with diabetic neuropathy, unspecified: Secondary | ICD-10-CM | POA: Diagnosis not present

## 2017-03-29 DIAGNOSIS — L603 Nail dystrophy: Secondary | ICD-10-CM | POA: Diagnosis not present

## 2017-03-29 DIAGNOSIS — B351 Tinea unguium: Secondary | ICD-10-CM | POA: Diagnosis not present

## 2017-03-31 DIAGNOSIS — R296 Repeated falls: Secondary | ICD-10-CM | POA: Diagnosis not present

## 2017-03-31 DIAGNOSIS — R2689 Other abnormalities of gait and mobility: Secondary | ICD-10-CM | POA: Diagnosis not present

## 2017-03-31 DIAGNOSIS — M6281 Muscle weakness (generalized): Secondary | ICD-10-CM | POA: Diagnosis not present

## 2017-04-02 DIAGNOSIS — R296 Repeated falls: Secondary | ICD-10-CM | POA: Diagnosis not present

## 2017-04-02 DIAGNOSIS — M6281 Muscle weakness (generalized): Secondary | ICD-10-CM | POA: Diagnosis not present

## 2017-04-02 DIAGNOSIS — R2689 Other abnormalities of gait and mobility: Secondary | ICD-10-CM | POA: Diagnosis not present

## 2017-04-08 DIAGNOSIS — R2689 Other abnormalities of gait and mobility: Secondary | ICD-10-CM | POA: Diagnosis not present

## 2017-04-08 DIAGNOSIS — M6281 Muscle weakness (generalized): Secondary | ICD-10-CM | POA: Diagnosis not present

## 2017-04-08 DIAGNOSIS — R296 Repeated falls: Secondary | ICD-10-CM | POA: Diagnosis not present

## 2017-04-10 DIAGNOSIS — R2689 Other abnormalities of gait and mobility: Secondary | ICD-10-CM | POA: Diagnosis not present

## 2017-04-10 DIAGNOSIS — R296 Repeated falls: Secondary | ICD-10-CM | POA: Diagnosis not present

## 2017-04-10 DIAGNOSIS — M6281 Muscle weakness (generalized): Secondary | ICD-10-CM | POA: Diagnosis not present

## 2017-04-15 DIAGNOSIS — R2689 Other abnormalities of gait and mobility: Secondary | ICD-10-CM | POA: Diagnosis not present

## 2017-04-15 DIAGNOSIS — M6281 Muscle weakness (generalized): Secondary | ICD-10-CM | POA: Diagnosis not present

## 2017-04-15 DIAGNOSIS — R296 Repeated falls: Secondary | ICD-10-CM | POA: Diagnosis not present

## 2017-04-18 DIAGNOSIS — R296 Repeated falls: Secondary | ICD-10-CM | POA: Diagnosis not present

## 2017-04-18 DIAGNOSIS — R2689 Other abnormalities of gait and mobility: Secondary | ICD-10-CM | POA: Diagnosis not present

## 2017-04-18 DIAGNOSIS — M6281 Muscle weakness (generalized): Secondary | ICD-10-CM | POA: Diagnosis not present

## 2017-04-23 DIAGNOSIS — R296 Repeated falls: Secondary | ICD-10-CM | POA: Diagnosis not present

## 2017-04-23 DIAGNOSIS — R2689 Other abnormalities of gait and mobility: Secondary | ICD-10-CM | POA: Diagnosis not present

## 2017-04-23 DIAGNOSIS — M6281 Muscle weakness (generalized): Secondary | ICD-10-CM | POA: Diagnosis not present

## 2017-04-25 DIAGNOSIS — R2689 Other abnormalities of gait and mobility: Secondary | ICD-10-CM | POA: Diagnosis not present

## 2017-04-25 DIAGNOSIS — M6281 Muscle weakness (generalized): Secondary | ICD-10-CM | POA: Diagnosis not present

## 2017-04-25 DIAGNOSIS — R296 Repeated falls: Secondary | ICD-10-CM | POA: Diagnosis not present

## 2017-04-29 DIAGNOSIS — M6281 Muscle weakness (generalized): Secondary | ICD-10-CM | POA: Diagnosis not present

## 2017-04-29 DIAGNOSIS — R296 Repeated falls: Secondary | ICD-10-CM | POA: Diagnosis not present

## 2017-04-29 DIAGNOSIS — R2689 Other abnormalities of gait and mobility: Secondary | ICD-10-CM | POA: Diagnosis not present

## 2017-05-06 DIAGNOSIS — M6281 Muscle weakness (generalized): Secondary | ICD-10-CM | POA: Diagnosis not present

## 2017-05-06 DIAGNOSIS — R2689 Other abnormalities of gait and mobility: Secondary | ICD-10-CM | POA: Diagnosis not present

## 2017-05-06 DIAGNOSIS — R296 Repeated falls: Secondary | ICD-10-CM | POA: Diagnosis not present

## 2017-05-07 DIAGNOSIS — R2689 Other abnormalities of gait and mobility: Secondary | ICD-10-CM | POA: Diagnosis not present

## 2017-05-07 DIAGNOSIS — R296 Repeated falls: Secondary | ICD-10-CM | POA: Diagnosis not present

## 2017-05-07 DIAGNOSIS — M6281 Muscle weakness (generalized): Secondary | ICD-10-CM | POA: Diagnosis not present

## 2017-05-09 DIAGNOSIS — R2689 Other abnormalities of gait and mobility: Secondary | ICD-10-CM | POA: Diagnosis not present

## 2017-05-09 DIAGNOSIS — M6281 Muscle weakness (generalized): Secondary | ICD-10-CM | POA: Diagnosis not present

## 2017-05-09 DIAGNOSIS — R296 Repeated falls: Secondary | ICD-10-CM | POA: Diagnosis not present

## 2017-05-15 DIAGNOSIS — I10 Essential (primary) hypertension: Secondary | ICD-10-CM | POA: Diagnosis not present

## 2017-05-15 DIAGNOSIS — E785 Hyperlipidemia, unspecified: Secondary | ICD-10-CM | POA: Diagnosis not present

## 2017-05-15 DIAGNOSIS — E119 Type 2 diabetes mellitus without complications: Secondary | ICD-10-CM | POA: Diagnosis not present

## 2017-05-15 DIAGNOSIS — I509 Heart failure, unspecified: Secondary | ICD-10-CM | POA: Diagnosis not present

## 2017-05-16 DIAGNOSIS — M6281 Muscle weakness (generalized): Secondary | ICD-10-CM | POA: Diagnosis not present

## 2017-05-16 DIAGNOSIS — R2689 Other abnormalities of gait and mobility: Secondary | ICD-10-CM | POA: Diagnosis not present

## 2017-05-16 DIAGNOSIS — R296 Repeated falls: Secondary | ICD-10-CM | POA: Diagnosis not present

## 2017-05-17 DIAGNOSIS — R2689 Other abnormalities of gait and mobility: Secondary | ICD-10-CM | POA: Diagnosis not present

## 2017-05-17 DIAGNOSIS — R296 Repeated falls: Secondary | ICD-10-CM | POA: Diagnosis not present

## 2017-05-17 DIAGNOSIS — M6281 Muscle weakness (generalized): Secondary | ICD-10-CM | POA: Diagnosis not present

## 2017-05-20 DIAGNOSIS — R296 Repeated falls: Secondary | ICD-10-CM | POA: Diagnosis not present

## 2017-05-20 DIAGNOSIS — M6281 Muscle weakness (generalized): Secondary | ICD-10-CM | POA: Diagnosis not present

## 2017-05-20 DIAGNOSIS — R2689 Other abnormalities of gait and mobility: Secondary | ICD-10-CM | POA: Diagnosis not present

## 2017-05-21 DIAGNOSIS — R2689 Other abnormalities of gait and mobility: Secondary | ICD-10-CM | POA: Diagnosis not present

## 2017-05-21 DIAGNOSIS — R296 Repeated falls: Secondary | ICD-10-CM | POA: Diagnosis not present

## 2017-05-21 DIAGNOSIS — M6281 Muscle weakness (generalized): Secondary | ICD-10-CM | POA: Diagnosis not present

## 2017-05-27 DIAGNOSIS — R2689 Other abnormalities of gait and mobility: Secondary | ICD-10-CM | POA: Diagnosis not present

## 2017-05-27 DIAGNOSIS — M6281 Muscle weakness (generalized): Secondary | ICD-10-CM | POA: Diagnosis not present

## 2017-05-27 DIAGNOSIS — R296 Repeated falls: Secondary | ICD-10-CM | POA: Diagnosis not present

## 2017-05-30 DIAGNOSIS — M6281 Muscle weakness (generalized): Secondary | ICD-10-CM | POA: Diagnosis not present

## 2017-05-30 DIAGNOSIS — R296 Repeated falls: Secondary | ICD-10-CM | POA: Diagnosis not present

## 2017-05-30 DIAGNOSIS — R2689 Other abnormalities of gait and mobility: Secondary | ICD-10-CM | POA: Diagnosis not present

## 2017-06-13 DIAGNOSIS — M6281 Muscle weakness (generalized): Secondary | ICD-10-CM | POA: Diagnosis not present

## 2017-06-13 DIAGNOSIS — Z7984 Long term (current) use of oral hypoglycemic drugs: Secondary | ICD-10-CM | POA: Diagnosis not present

## 2017-06-13 DIAGNOSIS — E119 Type 2 diabetes mellitus without complications: Secondary | ICD-10-CM | POA: Diagnosis not present

## 2017-06-13 DIAGNOSIS — Z7982 Long term (current) use of aspirin: Secondary | ICD-10-CM | POA: Diagnosis not present

## 2017-06-13 DIAGNOSIS — I10 Essential (primary) hypertension: Secondary | ICD-10-CM | POA: Diagnosis not present

## 2017-06-13 DIAGNOSIS — R296 Repeated falls: Secondary | ICD-10-CM | POA: Diagnosis not present

## 2017-06-14 DIAGNOSIS — R296 Repeated falls: Secondary | ICD-10-CM | POA: Diagnosis not present

## 2017-06-14 DIAGNOSIS — Z7982 Long term (current) use of aspirin: Secondary | ICD-10-CM | POA: Diagnosis not present

## 2017-06-14 DIAGNOSIS — I10 Essential (primary) hypertension: Secondary | ICD-10-CM | POA: Diagnosis not present

## 2017-06-14 DIAGNOSIS — Z7984 Long term (current) use of oral hypoglycemic drugs: Secondary | ICD-10-CM | POA: Diagnosis not present

## 2017-06-14 DIAGNOSIS — E119 Type 2 diabetes mellitus without complications: Secondary | ICD-10-CM | POA: Diagnosis not present

## 2017-06-14 DIAGNOSIS — M6281 Muscle weakness (generalized): Secondary | ICD-10-CM | POA: Diagnosis not present

## 2017-06-18 DIAGNOSIS — I10 Essential (primary) hypertension: Secondary | ICD-10-CM | POA: Diagnosis not present

## 2017-06-18 DIAGNOSIS — Z7982 Long term (current) use of aspirin: Secondary | ICD-10-CM | POA: Diagnosis not present

## 2017-06-18 DIAGNOSIS — R296 Repeated falls: Secondary | ICD-10-CM | POA: Diagnosis not present

## 2017-06-18 DIAGNOSIS — M6281 Muscle weakness (generalized): Secondary | ICD-10-CM | POA: Diagnosis not present

## 2017-06-18 DIAGNOSIS — Z7984 Long term (current) use of oral hypoglycemic drugs: Secondary | ICD-10-CM | POA: Diagnosis not present

## 2017-06-18 DIAGNOSIS — E119 Type 2 diabetes mellitus without complications: Secondary | ICD-10-CM | POA: Diagnosis not present

## 2017-06-21 DIAGNOSIS — E785 Hyperlipidemia, unspecified: Secondary | ICD-10-CM | POA: Diagnosis not present

## 2017-06-21 DIAGNOSIS — E559 Vitamin D deficiency, unspecified: Secondary | ICD-10-CM | POA: Diagnosis not present

## 2017-06-21 DIAGNOSIS — I10 Essential (primary) hypertension: Secondary | ICD-10-CM | POA: Diagnosis not present

## 2017-06-21 DIAGNOSIS — E119 Type 2 diabetes mellitus without complications: Secondary | ICD-10-CM | POA: Diagnosis not present

## 2017-06-21 DIAGNOSIS — Z7984 Long term (current) use of oral hypoglycemic drugs: Secondary | ICD-10-CM | POA: Diagnosis not present

## 2017-06-25 DIAGNOSIS — Z7982 Long term (current) use of aspirin: Secondary | ICD-10-CM | POA: Diagnosis not present

## 2017-06-25 DIAGNOSIS — I10 Essential (primary) hypertension: Secondary | ICD-10-CM | POA: Diagnosis not present

## 2017-06-25 DIAGNOSIS — E119 Type 2 diabetes mellitus without complications: Secondary | ICD-10-CM | POA: Diagnosis not present

## 2017-06-25 DIAGNOSIS — Z7984 Long term (current) use of oral hypoglycemic drugs: Secondary | ICD-10-CM | POA: Diagnosis not present

## 2017-06-25 DIAGNOSIS — M6281 Muscle weakness (generalized): Secondary | ICD-10-CM | POA: Diagnosis not present

## 2017-06-25 DIAGNOSIS — R296 Repeated falls: Secondary | ICD-10-CM | POA: Diagnosis not present

## 2017-06-28 DIAGNOSIS — I10 Essential (primary) hypertension: Secondary | ICD-10-CM | POA: Diagnosis not present

## 2017-06-28 DIAGNOSIS — E119 Type 2 diabetes mellitus without complications: Secondary | ICD-10-CM | POA: Diagnosis not present

## 2017-06-28 DIAGNOSIS — Z7982 Long term (current) use of aspirin: Secondary | ICD-10-CM | POA: Diagnosis not present

## 2017-06-28 DIAGNOSIS — R296 Repeated falls: Secondary | ICD-10-CM | POA: Diagnosis not present

## 2017-06-28 DIAGNOSIS — Z7984 Long term (current) use of oral hypoglycemic drugs: Secondary | ICD-10-CM | POA: Diagnosis not present

## 2017-06-28 DIAGNOSIS — M6281 Muscle weakness (generalized): Secondary | ICD-10-CM | POA: Diagnosis not present

## 2017-07-01 DIAGNOSIS — I10 Essential (primary) hypertension: Secondary | ICD-10-CM | POA: Diagnosis not present

## 2017-07-01 DIAGNOSIS — Z7984 Long term (current) use of oral hypoglycemic drugs: Secondary | ICD-10-CM | POA: Diagnosis not present

## 2017-07-01 DIAGNOSIS — R296 Repeated falls: Secondary | ICD-10-CM | POA: Diagnosis not present

## 2017-07-01 DIAGNOSIS — Z7982 Long term (current) use of aspirin: Secondary | ICD-10-CM | POA: Diagnosis not present

## 2017-07-01 DIAGNOSIS — E119 Type 2 diabetes mellitus without complications: Secondary | ICD-10-CM | POA: Diagnosis not present

## 2017-07-01 DIAGNOSIS — M6281 Muscle weakness (generalized): Secondary | ICD-10-CM | POA: Diagnosis not present

## 2017-07-11 DIAGNOSIS — M6281 Muscle weakness (generalized): Secondary | ICD-10-CM | POA: Diagnosis not present

## 2017-07-11 DIAGNOSIS — Z7982 Long term (current) use of aspirin: Secondary | ICD-10-CM | POA: Diagnosis not present

## 2017-07-11 DIAGNOSIS — Z7984 Long term (current) use of oral hypoglycemic drugs: Secondary | ICD-10-CM | POA: Diagnosis not present

## 2017-07-11 DIAGNOSIS — R296 Repeated falls: Secondary | ICD-10-CM | POA: Diagnosis not present

## 2017-07-11 DIAGNOSIS — E119 Type 2 diabetes mellitus without complications: Secondary | ICD-10-CM | POA: Diagnosis not present

## 2017-07-11 DIAGNOSIS — I10 Essential (primary) hypertension: Secondary | ICD-10-CM | POA: Diagnosis not present

## 2017-08-07 DIAGNOSIS — E1140 Type 2 diabetes mellitus with diabetic neuropathy, unspecified: Secondary | ICD-10-CM | POA: Diagnosis not present

## 2017-08-07 DIAGNOSIS — B351 Tinea unguium: Secondary | ICD-10-CM | POA: Diagnosis not present

## 2017-08-07 DIAGNOSIS — L603 Nail dystrophy: Secondary | ICD-10-CM | POA: Diagnosis not present

## 2017-08-07 DIAGNOSIS — Z794 Long term (current) use of insulin: Secondary | ICD-10-CM | POA: Diagnosis not present

## 2017-08-21 DIAGNOSIS — E119 Type 2 diabetes mellitus without complications: Secondary | ICD-10-CM | POA: Diagnosis not present

## 2017-08-21 DIAGNOSIS — F0390 Unspecified dementia without behavioral disturbance: Secondary | ICD-10-CM | POA: Diagnosis not present

## 2017-08-21 DIAGNOSIS — I509 Heart failure, unspecified: Secondary | ICD-10-CM | POA: Diagnosis not present

## 2017-08-21 DIAGNOSIS — I10 Essential (primary) hypertension: Secondary | ICD-10-CM | POA: Diagnosis not present

## 2017-09-27 DIAGNOSIS — Z7984 Long term (current) use of oral hypoglycemic drugs: Secondary | ICD-10-CM | POA: Diagnosis not present

## 2017-09-27 DIAGNOSIS — M1990 Unspecified osteoarthritis, unspecified site: Secondary | ICD-10-CM | POA: Diagnosis not present

## 2017-09-27 DIAGNOSIS — E119 Type 2 diabetes mellitus without complications: Secondary | ICD-10-CM | POA: Diagnosis not present

## 2017-09-27 DIAGNOSIS — F0390 Unspecified dementia without behavioral disturbance: Secondary | ICD-10-CM | POA: Diagnosis not present

## 2017-09-27 DIAGNOSIS — I110 Hypertensive heart disease with heart failure: Secondary | ICD-10-CM | POA: Diagnosis not present

## 2017-09-27 DIAGNOSIS — E559 Vitamin D deficiency, unspecified: Secondary | ICD-10-CM | POA: Diagnosis not present

## 2017-10-04 DIAGNOSIS — I110 Hypertensive heart disease with heart failure: Secondary | ICD-10-CM | POA: Diagnosis not present

## 2017-10-04 DIAGNOSIS — Z7984 Long term (current) use of oral hypoglycemic drugs: Secondary | ICD-10-CM | POA: Diagnosis not present

## 2017-10-04 DIAGNOSIS — E559 Vitamin D deficiency, unspecified: Secondary | ICD-10-CM | POA: Diagnosis not present

## 2017-10-04 DIAGNOSIS — E119 Type 2 diabetes mellitus without complications: Secondary | ICD-10-CM | POA: Diagnosis not present

## 2017-10-04 DIAGNOSIS — M1990 Unspecified osteoarthritis, unspecified site: Secondary | ICD-10-CM | POA: Diagnosis not present

## 2017-10-04 DIAGNOSIS — F0390 Unspecified dementia without behavioral disturbance: Secondary | ICD-10-CM | POA: Diagnosis not present

## 2017-10-10 DIAGNOSIS — M1990 Unspecified osteoarthritis, unspecified site: Secondary | ICD-10-CM | POA: Diagnosis not present

## 2017-10-10 DIAGNOSIS — Z7984 Long term (current) use of oral hypoglycemic drugs: Secondary | ICD-10-CM | POA: Diagnosis not present

## 2017-10-10 DIAGNOSIS — E559 Vitamin D deficiency, unspecified: Secondary | ICD-10-CM | POA: Diagnosis not present

## 2017-10-10 DIAGNOSIS — I110 Hypertensive heart disease with heart failure: Secondary | ICD-10-CM | POA: Diagnosis not present

## 2017-10-10 DIAGNOSIS — E119 Type 2 diabetes mellitus without complications: Secondary | ICD-10-CM | POA: Diagnosis not present

## 2017-10-10 DIAGNOSIS — F0390 Unspecified dementia without behavioral disturbance: Secondary | ICD-10-CM | POA: Diagnosis not present

## 2017-10-11 DIAGNOSIS — F0390 Unspecified dementia without behavioral disturbance: Secondary | ICD-10-CM | POA: Diagnosis not present

## 2017-10-11 DIAGNOSIS — Z7984 Long term (current) use of oral hypoglycemic drugs: Secondary | ICD-10-CM | POA: Diagnosis not present

## 2017-10-11 DIAGNOSIS — I110 Hypertensive heart disease with heart failure: Secondary | ICD-10-CM | POA: Diagnosis not present

## 2017-10-11 DIAGNOSIS — E559 Vitamin D deficiency, unspecified: Secondary | ICD-10-CM | POA: Diagnosis not present

## 2017-10-11 DIAGNOSIS — M1990 Unspecified osteoarthritis, unspecified site: Secondary | ICD-10-CM | POA: Diagnosis not present

## 2017-10-11 DIAGNOSIS — E119 Type 2 diabetes mellitus without complications: Secondary | ICD-10-CM | POA: Diagnosis not present

## 2017-10-15 IMAGING — CT CT HEAD W/O CM
4 series · 16 of 47 positions shown, 18 images · non-contrast
Comparison: CT scan of December 05, 2015.

CLINICAL DATA: Dizziness.

EXAM:
CT HEAD WITHOUT CONTRAST
TECHNIQUE: Contiguous axial images were obtained from the base of the skull
through the vertex without intravenous contrast.

[Series 2: head without · axial · non-contrast · 0.40mm/px · z∈[-135,-15]mm · 7 of 32 slices shown, 9 images]
[im 4/32  brain]
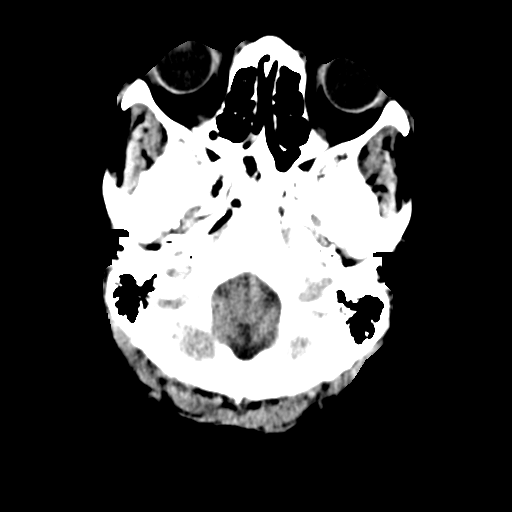
[im 4/32  bone]
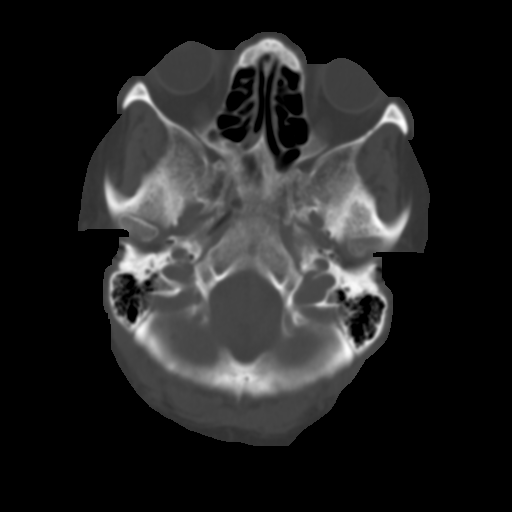
[im 8/32  brain]
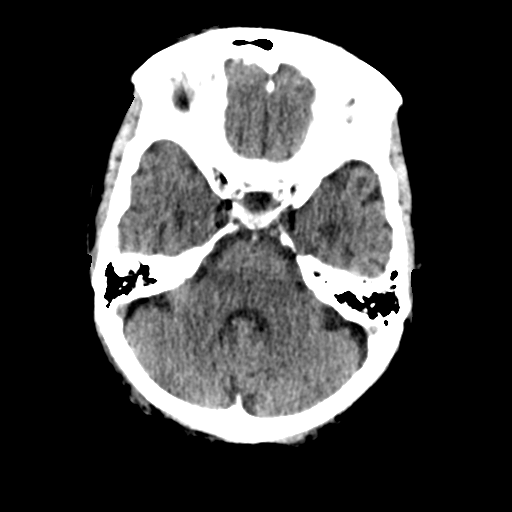
[im 12/32  brain]
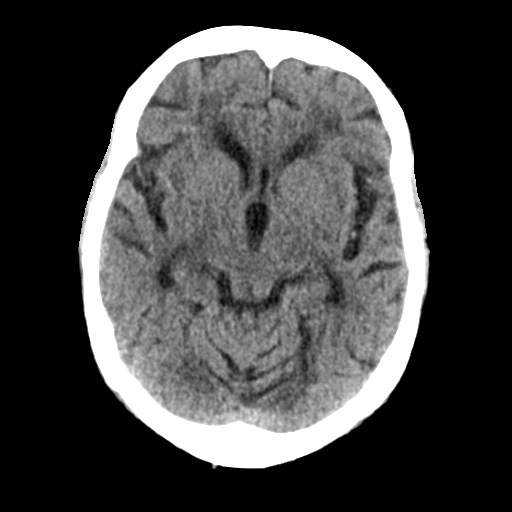
[im 16/32  brain]
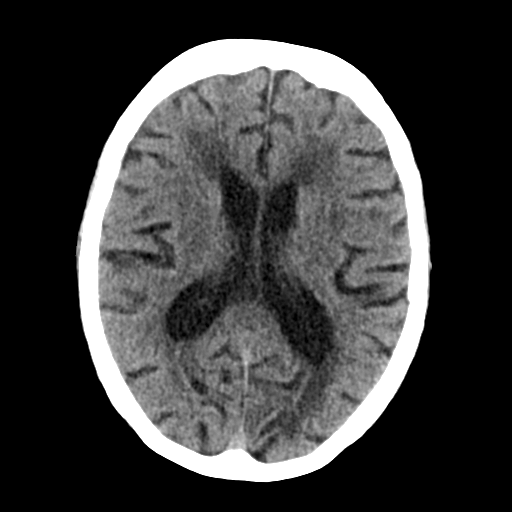
[im 20/32  brain]
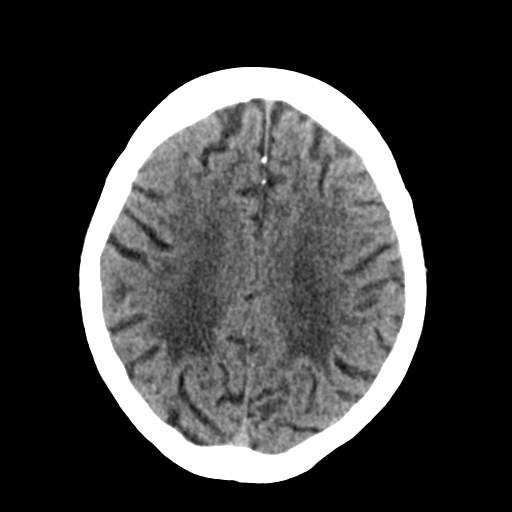
[im 20/32  bone]
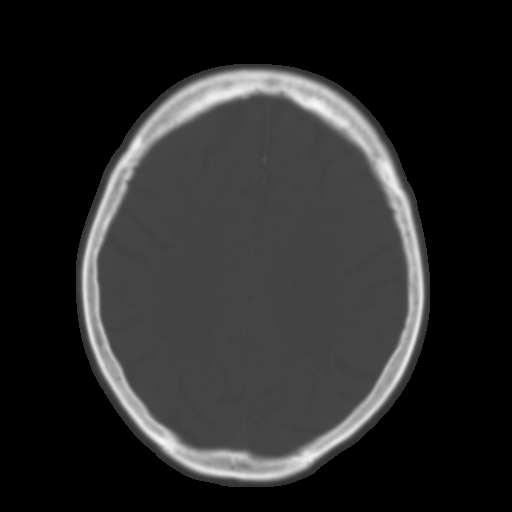
[im 24/32  brain]
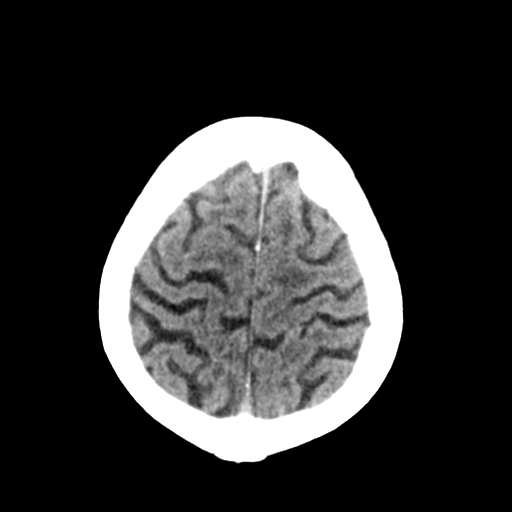
[im 28/32  brain]
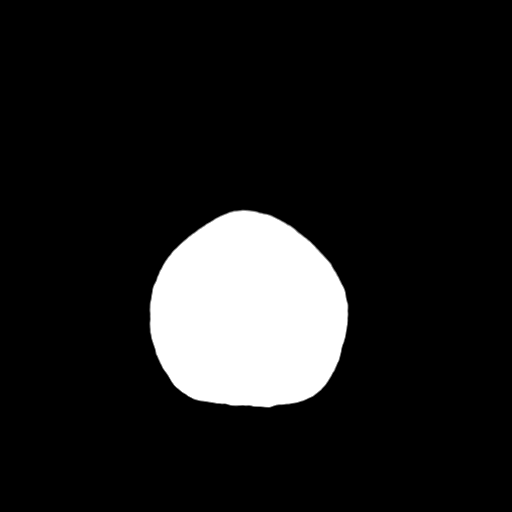

[Series 3: head bone · axial · 0.40mm/px · z∈[-136,-104]mm · 3 of 79 slices shown]
[im 8/79  bone]
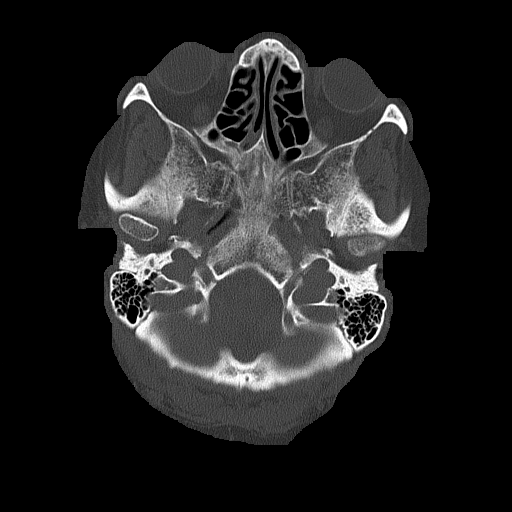
[im 16/79  bone]
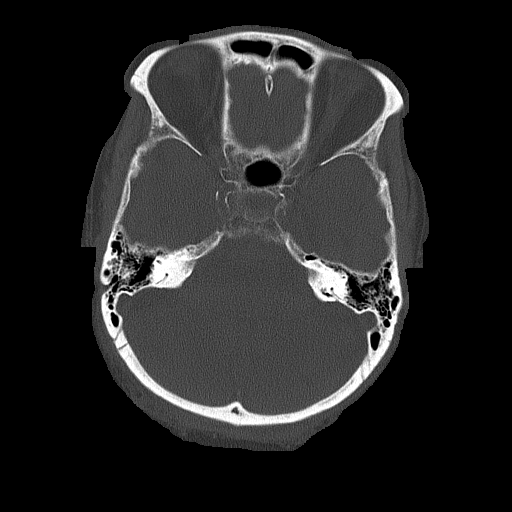
[im 24/79  bone]
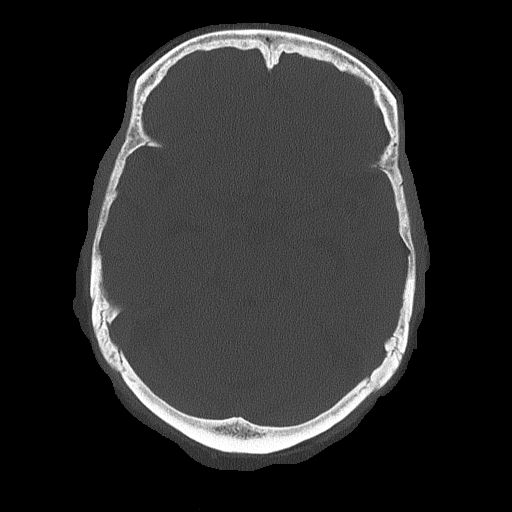

[Series 4: head without cor · coronal · non-contrast · 0.30mm/px · 3 of 67 slices shown]
[im 23/67  brain]
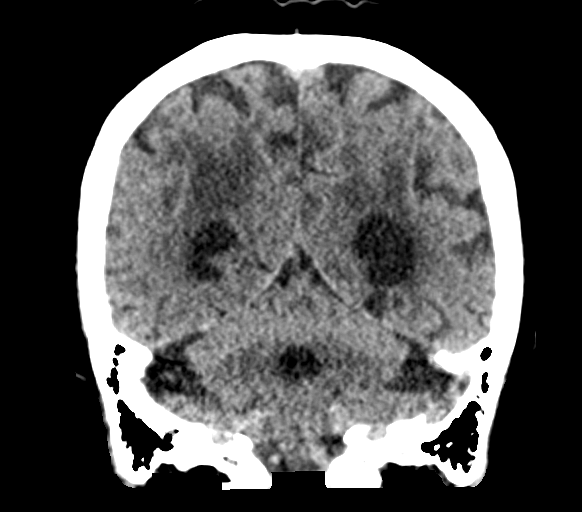
[im 30/67  brain]
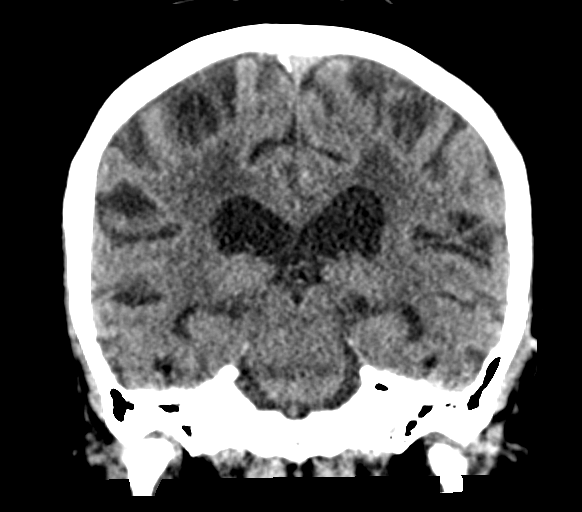
[im 37/67  brain]
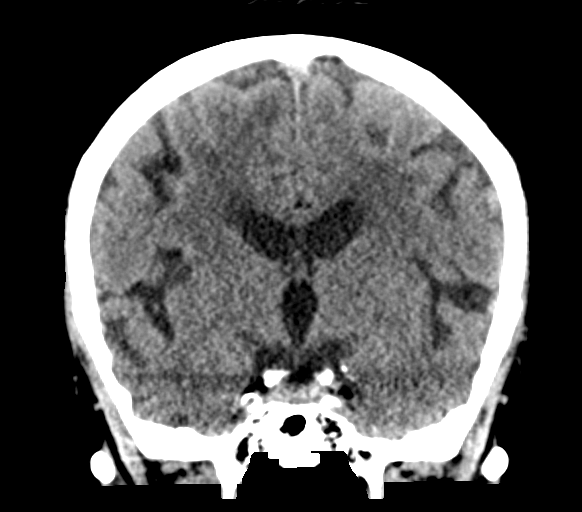

[Series 5: head without sag · sagittal · non-contrast · 0.30mm/px · 3 of 51 slices shown]
[im 17/51  brain]
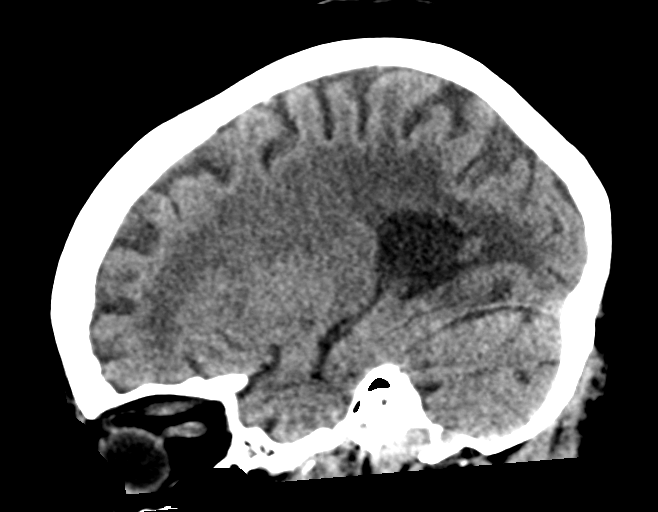
[im 26/51  brain]
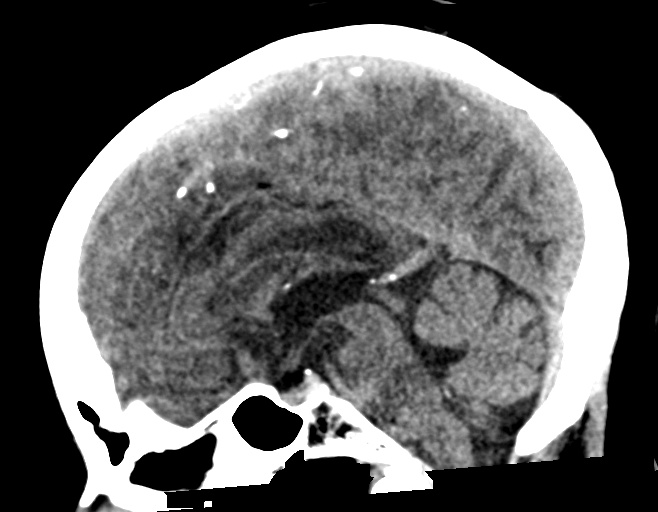
[im 34/51  brain]
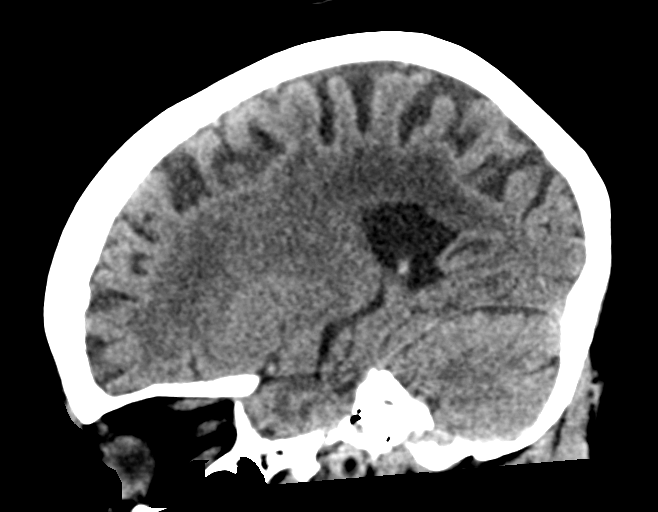

[16 of 47 positions shown; findings below may reference images not displayed]

FINDINGS: Brain: Mild chronic ischemic white matter disease is noted. Left
occipital encephalomalacia is noted consistent with old infarction.
Minimal diffuse cortical atrophy is noted. No mass effect or midline
shift is noted. Ventricular size is within normal limits. There is
no evidence of mass lesion, hemorrhage or acute infarction.

Vascular: Atherosclerosis of carotid siphons is noted.

Skull: Normal. Negative for fracture or focal lesion.

Sinuses/Orbits: No acute finding.

Other: None.
IMPRESSION: Mild chronic ischemic white matter disease. Old left occipital
infarction. Minimal diffuse cortical atrophy. No acute intracranial
abnormality seen.

## 2017-10-16 DIAGNOSIS — Z7984 Long term (current) use of oral hypoglycemic drugs: Secondary | ICD-10-CM | POA: Diagnosis not present

## 2017-10-16 DIAGNOSIS — M1990 Unspecified osteoarthritis, unspecified site: Secondary | ICD-10-CM | POA: Diagnosis not present

## 2017-10-16 DIAGNOSIS — I110 Hypertensive heart disease with heart failure: Secondary | ICD-10-CM | POA: Diagnosis not present

## 2017-10-16 DIAGNOSIS — E559 Vitamin D deficiency, unspecified: Secondary | ICD-10-CM | POA: Diagnosis not present

## 2017-10-16 DIAGNOSIS — E119 Type 2 diabetes mellitus without complications: Secondary | ICD-10-CM | POA: Diagnosis not present

## 2017-10-16 DIAGNOSIS — F0390 Unspecified dementia without behavioral disturbance: Secondary | ICD-10-CM | POA: Diagnosis not present

## 2017-10-20 IMAGING — DX DG PELVIS 1-2V
1 series · 1 of 1 positions shown · non-contrast
Comparison: None.

CLINICAL DATA: Tenderness in the pelvis and right knee status post
fall

EXAM:
PELVIS - 1-2 VIEW

[t pelvis ap]
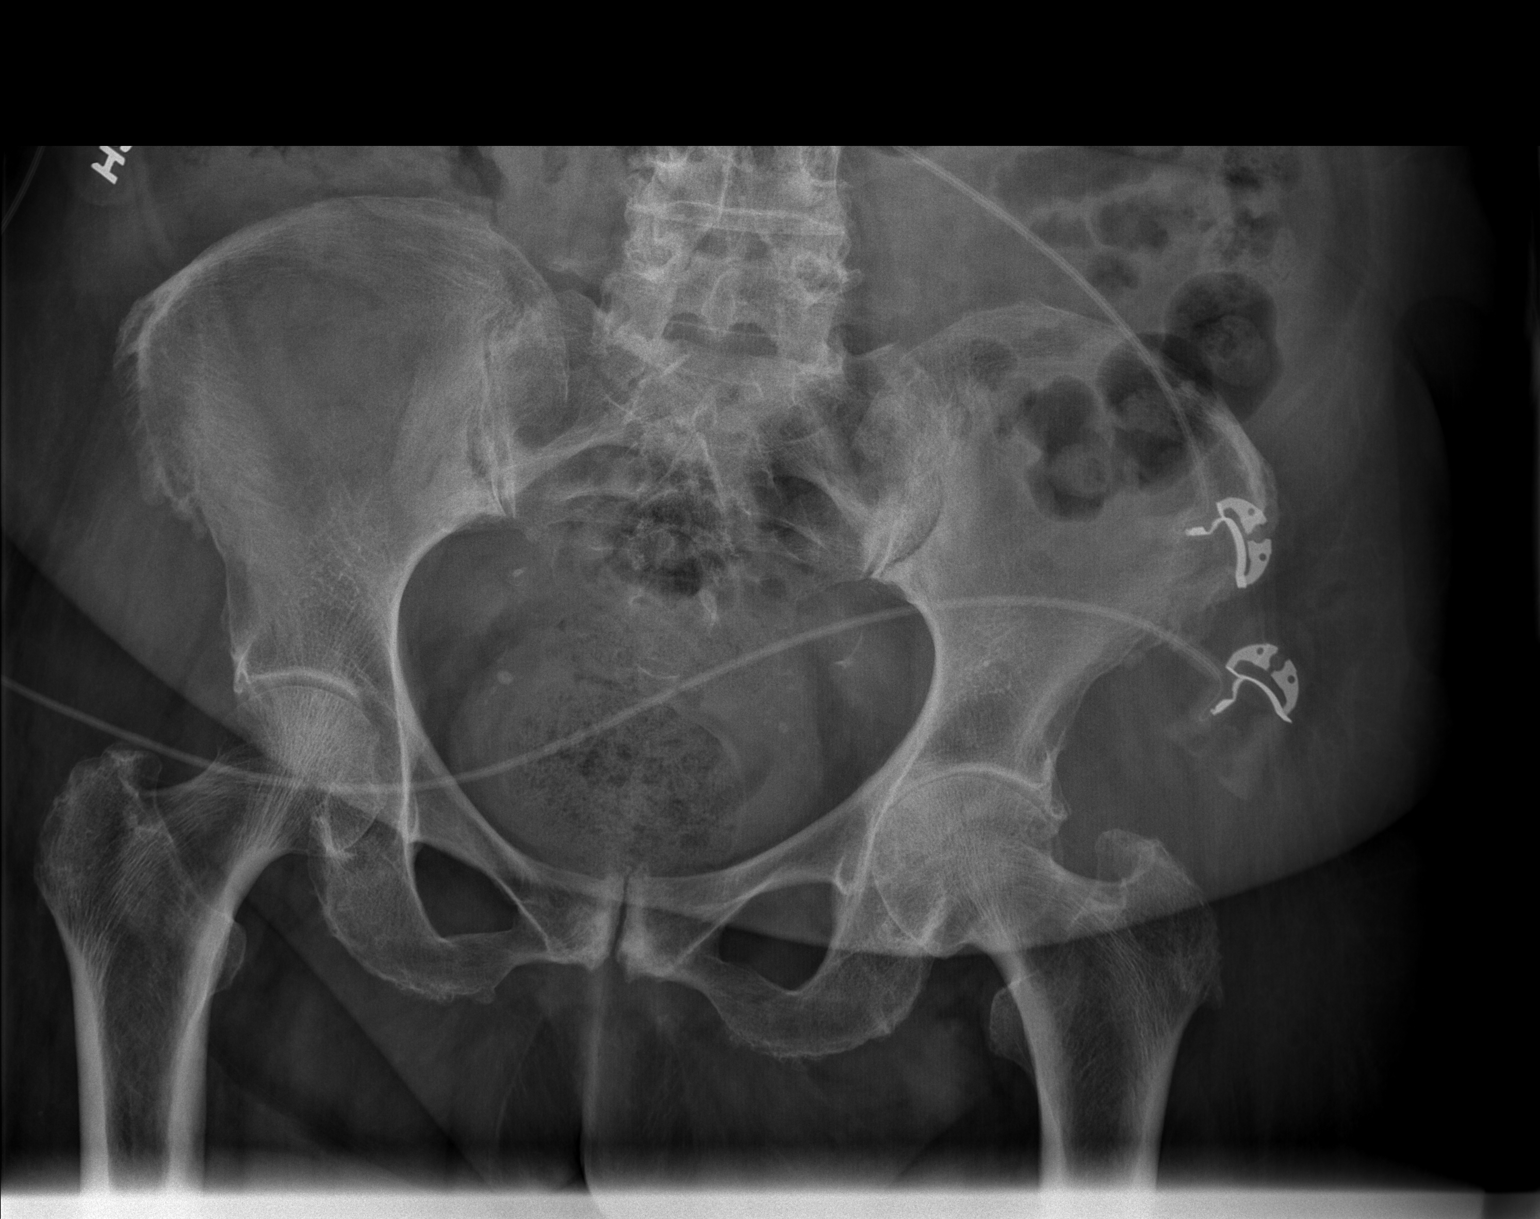

[1 of 1 positions shown; findings below may reference images not displayed]

FINDINGS: No acute fracture or dislocation. Mild osteoarthritis of the left
hip. Degenerative changes of the pubic symphysis. No lytic or
sclerotic osseous lesion.
IMPRESSION: Mild osteoarthritis of the left hip.

## 2017-10-20 IMAGING — DX DG KNEE 1-2V*R*
2 series · 2 of 2 positions shown · non-contrast
Comparison: None.

CLINICAL DATA: [AGE] female with an anterior right knee pain
after a fall today. Initial encounter.

EXAM:
RIGHT KNEE - 1-2 VIEW

[t knee ap right]
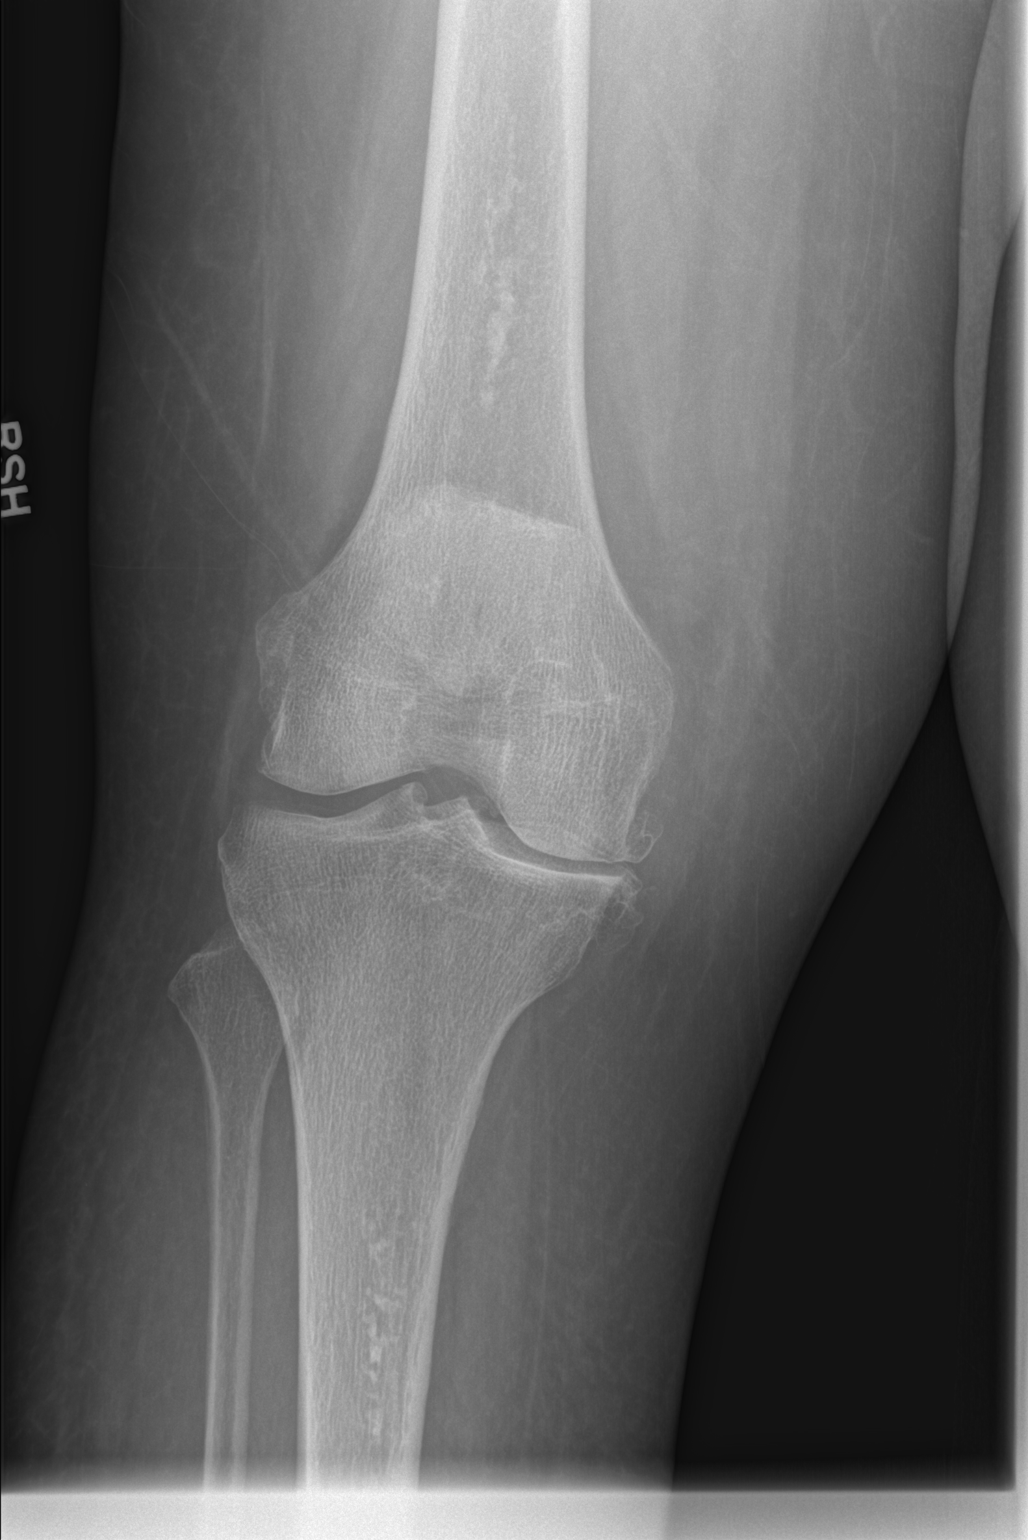

[x knee lat right]
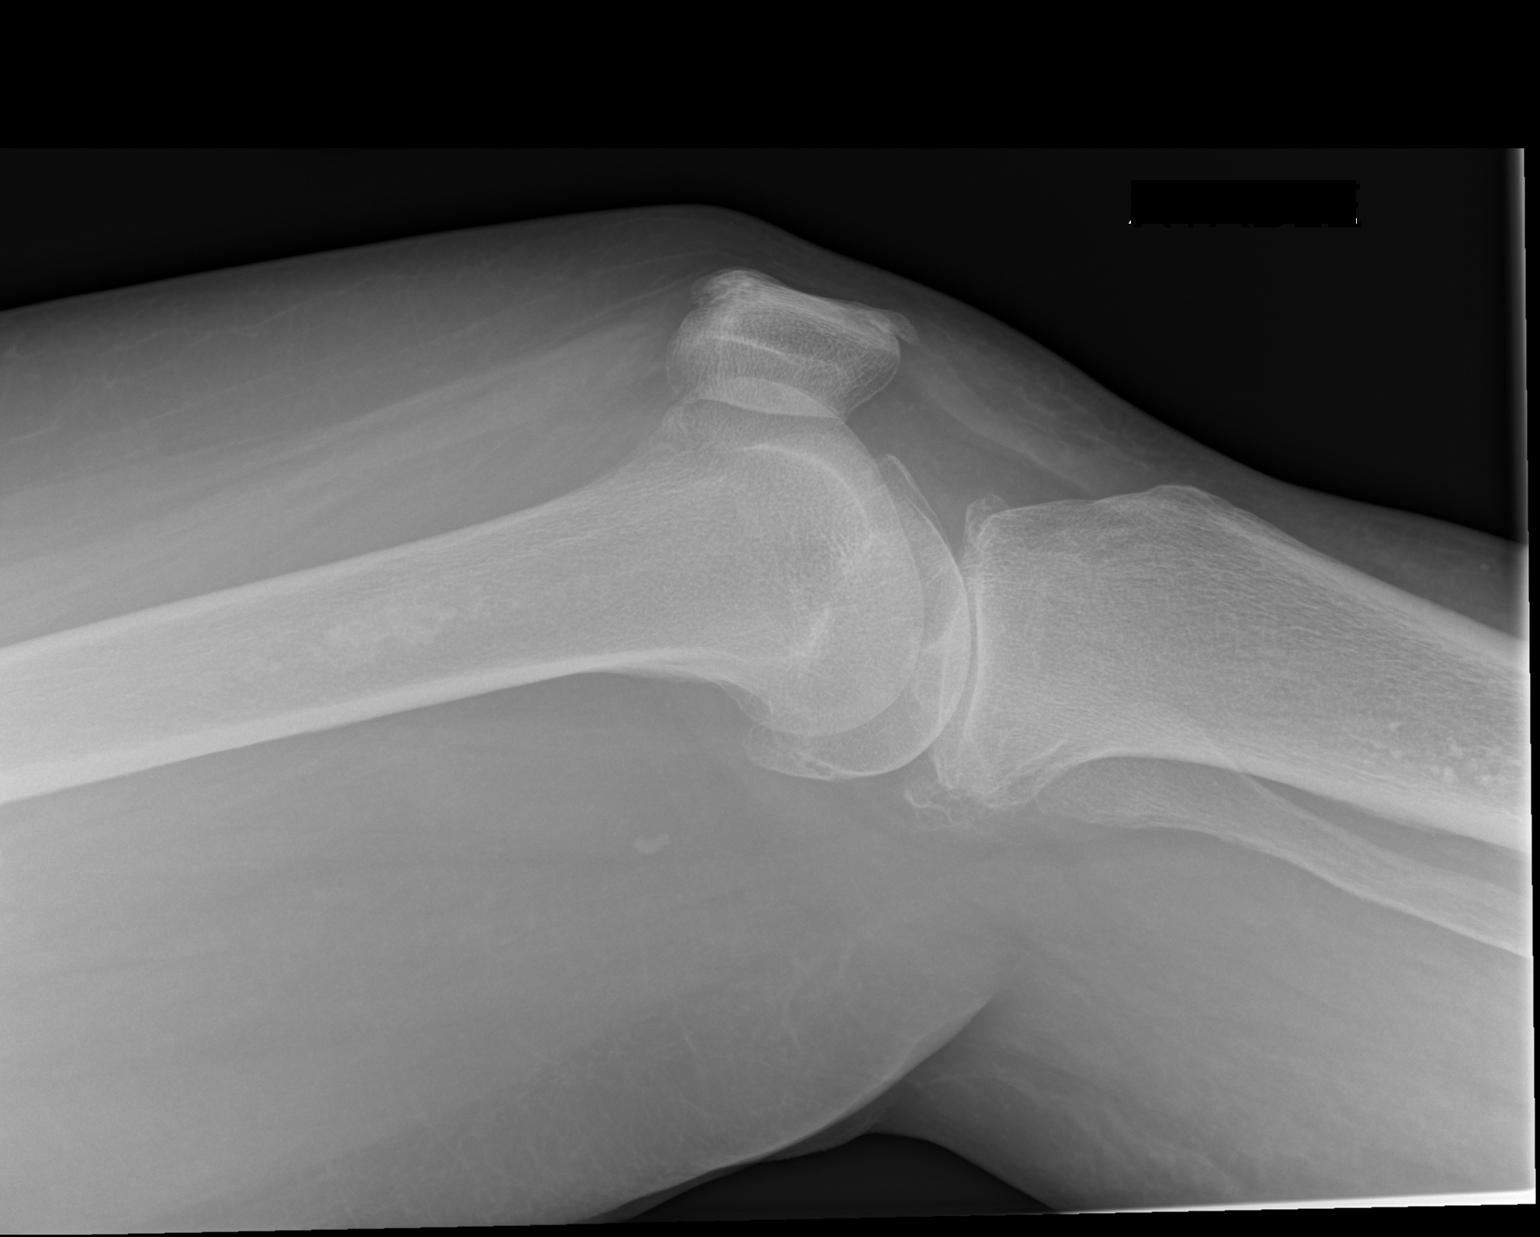

[2 of 2 positions shown; findings below may reference images not displayed]

FINDINGS: Severe medial compartment joint space loss and degenerative
spurring. More mild lateral and patellofemoral compartment
degeneration. The patella appears intact. No definite joint effusion
on the cross-table lateral view. No acute osseous abnormality
identified.
IMPRESSION: 1. No acute fracture or dislocation identified about the right knee.
2. Moderate to severe medial compartment joint degeneration.

## 2017-10-24 DIAGNOSIS — F0280 Dementia in other diseases classified elsewhere without behavioral disturbance: Secondary | ICD-10-CM | POA: Diagnosis not present

## 2017-10-24 DIAGNOSIS — I10 Essential (primary) hypertension: Secondary | ICD-10-CM | POA: Diagnosis not present

## 2017-10-24 DIAGNOSIS — R531 Weakness: Secondary | ICD-10-CM | POA: Diagnosis not present

## 2017-10-24 DIAGNOSIS — E119 Type 2 diabetes mellitus without complications: Secondary | ICD-10-CM | POA: Diagnosis not present

## 2017-11-12 DIAGNOSIS — Z79899 Other long term (current) drug therapy: Secondary | ICD-10-CM | POA: Diagnosis not present

## 2017-12-19 DIAGNOSIS — E119 Type 2 diabetes mellitus without complications: Secondary | ICD-10-CM | POA: Diagnosis not present

## 2017-12-19 DIAGNOSIS — R531 Weakness: Secondary | ICD-10-CM | POA: Diagnosis not present

## 2017-12-19 DIAGNOSIS — Z79899 Other long term (current) drug therapy: Secondary | ICD-10-CM | POA: Diagnosis not present

## 2017-12-19 DIAGNOSIS — F0390 Unspecified dementia without behavioral disturbance: Secondary | ICD-10-CM | POA: Diagnosis not present

## 2017-12-19 DIAGNOSIS — R51 Headache: Secondary | ICD-10-CM | POA: Diagnosis not present

## 2017-12-19 DIAGNOSIS — I10 Essential (primary) hypertension: Secondary | ICD-10-CM | POA: Diagnosis not present

## 2018-01-07 DIAGNOSIS — F0390 Unspecified dementia without behavioral disturbance: Secondary | ICD-10-CM | POA: Diagnosis not present

## 2018-01-07 DIAGNOSIS — R531 Weakness: Secondary | ICD-10-CM | POA: Diagnosis not present

## 2018-01-07 DIAGNOSIS — I10 Essential (primary) hypertension: Secondary | ICD-10-CM | POA: Diagnosis not present

## 2018-01-07 DIAGNOSIS — E119 Type 2 diabetes mellitus without complications: Secondary | ICD-10-CM | POA: Diagnosis not present

## 2018-01-14 DIAGNOSIS — Z79899 Other long term (current) drug therapy: Secondary | ICD-10-CM | POA: Diagnosis not present

## 2018-01-14 DIAGNOSIS — E119 Type 2 diabetes mellitus without complications: Secondary | ICD-10-CM | POA: Diagnosis not present

## 2018-01-14 DIAGNOSIS — R262 Difficulty in walking, not elsewhere classified: Secondary | ICD-10-CM | POA: Diagnosis not present

## 2018-01-14 DIAGNOSIS — R531 Weakness: Secondary | ICD-10-CM | POA: Diagnosis not present

## 2018-01-14 DIAGNOSIS — F0390 Unspecified dementia without behavioral disturbance: Secondary | ICD-10-CM | POA: Diagnosis not present

## 2018-01-14 DIAGNOSIS — I10 Essential (primary) hypertension: Secondary | ICD-10-CM | POA: Diagnosis not present

## 2018-03-04 DIAGNOSIS — R54 Age-related physical debility: Secondary | ICD-10-CM | POA: Diagnosis not present

## 2018-03-04 DIAGNOSIS — R269 Unspecified abnormalities of gait and mobility: Secondary | ICD-10-CM | POA: Diagnosis not present

## 2018-03-04 DIAGNOSIS — I10 Essential (primary) hypertension: Secondary | ICD-10-CM | POA: Diagnosis not present

## 2018-03-04 DIAGNOSIS — Z79899 Other long term (current) drug therapy: Secondary | ICD-10-CM | POA: Diagnosis not present

## 2018-03-04 DIAGNOSIS — E119 Type 2 diabetes mellitus without complications: Secondary | ICD-10-CM | POA: Diagnosis not present

## 2018-03-25 DIAGNOSIS — R54 Age-related physical debility: Secondary | ICD-10-CM | POA: Diagnosis not present

## 2018-03-25 DIAGNOSIS — E119 Type 2 diabetes mellitus without complications: Secondary | ICD-10-CM | POA: Diagnosis not present

## 2018-03-25 DIAGNOSIS — I10 Essential (primary) hypertension: Secondary | ICD-10-CM | POA: Diagnosis not present

## 2018-03-25 DIAGNOSIS — R21 Rash and other nonspecific skin eruption: Secondary | ICD-10-CM | POA: Diagnosis not present

## 2018-05-13 DIAGNOSIS — Z79899 Other long term (current) drug therapy: Secondary | ICD-10-CM | POA: Diagnosis not present

## 2018-05-13 DIAGNOSIS — R54 Age-related physical debility: Secondary | ICD-10-CM | POA: Diagnosis not present

## 2018-05-13 DIAGNOSIS — I10 Essential (primary) hypertension: Secondary | ICD-10-CM | POA: Diagnosis not present

## 2018-05-13 DIAGNOSIS — R21 Rash and other nonspecific skin eruption: Secondary | ICD-10-CM | POA: Diagnosis not present

## 2018-06-26 DIAGNOSIS — H109 Unspecified conjunctivitis: Secondary | ICD-10-CM | POA: Diagnosis not present

## 2018-06-26 DIAGNOSIS — R54 Age-related physical debility: Secondary | ICD-10-CM | POA: Diagnosis not present

## 2018-06-26 DIAGNOSIS — Z79899 Other long term (current) drug therapy: Secondary | ICD-10-CM | POA: Diagnosis not present

## 2018-06-26 DIAGNOSIS — I10 Essential (primary) hypertension: Secondary | ICD-10-CM | POA: Diagnosis not present

## 2018-06-26 DIAGNOSIS — E119 Type 2 diabetes mellitus without complications: Secondary | ICD-10-CM | POA: Diagnosis not present

## 2018-08-21 DIAGNOSIS — R531 Weakness: Secondary | ICD-10-CM | POA: Diagnosis not present

## 2018-08-21 DIAGNOSIS — E119 Type 2 diabetes mellitus without complications: Secondary | ICD-10-CM | POA: Diagnosis not present

## 2018-08-21 DIAGNOSIS — Z79899 Other long term (current) drug therapy: Secondary | ICD-10-CM | POA: Diagnosis not present

## 2018-08-21 DIAGNOSIS — I10 Essential (primary) hypertension: Secondary | ICD-10-CM | POA: Diagnosis not present

## 2018-08-21 DIAGNOSIS — R54 Age-related physical debility: Secondary | ICD-10-CM | POA: Diagnosis not present

## 2018-08-22 DIAGNOSIS — M3501 Sicca syndrome with keratoconjunctivitis: Secondary | ICD-10-CM | POA: Diagnosis not present

## 2018-08-22 DIAGNOSIS — E119 Type 2 diabetes mellitus without complications: Secondary | ICD-10-CM | POA: Diagnosis not present

## 2018-08-22 DIAGNOSIS — H04213 Epiphora due to excess lacrimation, bilateral lacrimal glands: Secondary | ICD-10-CM | POA: Diagnosis not present

## 2018-08-22 DIAGNOSIS — H04123 Dry eye syndrome of bilateral lacrimal glands: Secondary | ICD-10-CM | POA: Diagnosis not present

## 2018-09-23 DIAGNOSIS — R531 Weakness: Secondary | ICD-10-CM | POA: Diagnosis not present

## 2018-09-23 DIAGNOSIS — Z79899 Other long term (current) drug therapy: Secondary | ICD-10-CM | POA: Diagnosis not present

## 2018-09-23 DIAGNOSIS — I10 Essential (primary) hypertension: Secondary | ICD-10-CM | POA: Diagnosis not present

## 2018-09-23 DIAGNOSIS — E119 Type 2 diabetes mellitus without complications: Secondary | ICD-10-CM | POA: Diagnosis not present

## 2018-10-07 DIAGNOSIS — E119 Type 2 diabetes mellitus without complications: Secondary | ICD-10-CM | POA: Diagnosis not present

## 2018-10-07 DIAGNOSIS — I10 Essential (primary) hypertension: Secondary | ICD-10-CM | POA: Diagnosis not present

## 2019-01-27 DIAGNOSIS — R531 Weakness: Secondary | ICD-10-CM | POA: Diagnosis not present

## 2019-01-27 DIAGNOSIS — Z79899 Other long term (current) drug therapy: Secondary | ICD-10-CM | POA: Diagnosis not present

## 2019-01-27 DIAGNOSIS — I10 Essential (primary) hypertension: Secondary | ICD-10-CM | POA: Diagnosis not present

## 2019-01-27 DIAGNOSIS — E119 Type 2 diabetes mellitus without complications: Secondary | ICD-10-CM | POA: Diagnosis not present

## 2019-02-26 DIAGNOSIS — Z79899 Other long term (current) drug therapy: Secondary | ICD-10-CM | POA: Diagnosis not present

## 2019-02-26 DIAGNOSIS — I10 Essential (primary) hypertension: Secondary | ICD-10-CM | POA: Diagnosis not present

## 2019-02-26 DIAGNOSIS — R531 Weakness: Secondary | ICD-10-CM | POA: Diagnosis not present

## 2019-02-26 DIAGNOSIS — E119 Type 2 diabetes mellitus without complications: Secondary | ICD-10-CM | POA: Diagnosis not present

## 2019-03-03 DIAGNOSIS — I10 Essential (primary) hypertension: Secondary | ICD-10-CM | POA: Diagnosis not present

## 2019-03-03 DIAGNOSIS — E119 Type 2 diabetes mellitus without complications: Secondary | ICD-10-CM | POA: Diagnosis not present

## 2019-03-23 DIAGNOSIS — E119 Type 2 diabetes mellitus without complications: Secondary | ICD-10-CM | POA: Diagnosis not present

## 2019-03-23 DIAGNOSIS — I10 Essential (primary) hypertension: Secondary | ICD-10-CM | POA: Diagnosis not present

## 2019-03-23 DIAGNOSIS — Z0000 Encounter for general adult medical examination without abnormal findings: Secondary | ICD-10-CM | POA: Diagnosis not present

## 2019-03-23 DIAGNOSIS — R54 Age-related physical debility: Secondary | ICD-10-CM | POA: Diagnosis not present

## 2019-03-30 DIAGNOSIS — R609 Edema, unspecified: Secondary | ICD-10-CM | POA: Diagnosis not present

## 2019-03-30 DIAGNOSIS — I10 Essential (primary) hypertension: Secondary | ICD-10-CM | POA: Diagnosis not present

## 2019-03-30 DIAGNOSIS — E119 Type 2 diabetes mellitus without complications: Secondary | ICD-10-CM | POA: Diagnosis not present

## 2019-03-30 DIAGNOSIS — Z79899 Other long term (current) drug therapy: Secondary | ICD-10-CM | POA: Diagnosis not present

## 2019-03-30 DIAGNOSIS — I872 Venous insufficiency (chronic) (peripheral): Secondary | ICD-10-CM | POA: Diagnosis not present

## 2019-03-30 DIAGNOSIS — R269 Unspecified abnormalities of gait and mobility: Secondary | ICD-10-CM | POA: Diagnosis not present

## 2019-06-10 DIAGNOSIS — E1121 Type 2 diabetes mellitus with diabetic nephropathy: Secondary | ICD-10-CM | POA: Diagnosis not present

## 2019-06-10 DIAGNOSIS — R609 Edema, unspecified: Secondary | ICD-10-CM | POA: Diagnosis not present

## 2019-06-10 DIAGNOSIS — E1351 Other specified diabetes mellitus with diabetic peripheral angiopathy without gangrene: Secondary | ICD-10-CM | POA: Diagnosis not present

## 2019-06-10 DIAGNOSIS — I10 Essential (primary) hypertension: Secondary | ICD-10-CM | POA: Diagnosis not present

## 2019-06-10 DIAGNOSIS — Z79899 Other long term (current) drug therapy: Secondary | ICD-10-CM | POA: Diagnosis not present

## 2019-06-10 DIAGNOSIS — N189 Chronic kidney disease, unspecified: Secondary | ICD-10-CM | POA: Diagnosis not present

## 2019-06-23 DIAGNOSIS — E119 Type 2 diabetes mellitus without complications: Secondary | ICD-10-CM | POA: Diagnosis not present

## 2019-06-23 DIAGNOSIS — I10 Essential (primary) hypertension: Secondary | ICD-10-CM | POA: Diagnosis not present

## 2019-06-23 DIAGNOSIS — Z79899 Other long term (current) drug therapy: Secondary | ICD-10-CM | POA: Diagnosis not present

## 2019-07-01 DIAGNOSIS — Z23 Encounter for immunization: Secondary | ICD-10-CM | POA: Diagnosis not present

## 2019-07-01 DIAGNOSIS — N1831 Chronic kidney disease, stage 3a: Secondary | ICD-10-CM | POA: Diagnosis not present

## 2019-07-01 DIAGNOSIS — E1351 Other specified diabetes mellitus with diabetic peripheral angiopathy without gangrene: Secondary | ICD-10-CM | POA: Diagnosis not present

## 2019-07-01 DIAGNOSIS — I10 Essential (primary) hypertension: Secondary | ICD-10-CM | POA: Diagnosis not present

## 2019-07-01 DIAGNOSIS — E1121 Type 2 diabetes mellitus with diabetic nephropathy: Secondary | ICD-10-CM | POA: Diagnosis not present

## 2019-07-30 DIAGNOSIS — E1351 Other specified diabetes mellitus with diabetic peripheral angiopathy without gangrene: Secondary | ICD-10-CM | POA: Diagnosis not present

## 2019-07-30 DIAGNOSIS — E1121 Type 2 diabetes mellitus with diabetic nephropathy: Secondary | ICD-10-CM | POA: Diagnosis not present

## 2019-07-30 DIAGNOSIS — Z79899 Other long term (current) drug therapy: Secondary | ICD-10-CM | POA: Diagnosis not present

## 2019-07-30 DIAGNOSIS — N1831 Chronic kidney disease, stage 3a: Secondary | ICD-10-CM | POA: Diagnosis not present

## 2019-07-30 DIAGNOSIS — I10 Essential (primary) hypertension: Secondary | ICD-10-CM | POA: Diagnosis not present

## 2019-08-10 DIAGNOSIS — E1351 Other specified diabetes mellitus with diabetic peripheral angiopathy without gangrene: Secondary | ICD-10-CM | POA: Diagnosis not present

## 2019-08-10 DIAGNOSIS — E1121 Type 2 diabetes mellitus with diabetic nephropathy: Secondary | ICD-10-CM | POA: Diagnosis not present

## 2019-08-10 DIAGNOSIS — Z79899 Other long term (current) drug therapy: Secondary | ICD-10-CM | POA: Diagnosis not present

## 2019-08-10 DIAGNOSIS — I10 Essential (primary) hypertension: Secondary | ICD-10-CM | POA: Diagnosis not present

## 2019-08-10 DIAGNOSIS — E1169 Type 2 diabetes mellitus with other specified complication: Secondary | ICD-10-CM | POA: Diagnosis not present

## 2019-08-10 DIAGNOSIS — E785 Hyperlipidemia, unspecified: Secondary | ICD-10-CM | POA: Diagnosis not present

## 2019-08-10 DIAGNOSIS — W19XXXA Unspecified fall, initial encounter: Secondary | ICD-10-CM | POA: Diagnosis not present

## 2019-08-10 DIAGNOSIS — N1831 Chronic kidney disease, stage 3a: Secondary | ICD-10-CM | POA: Diagnosis not present

## 2021-09-10 DEATH — deceased
